# Patient Record
Sex: Male | Born: 1935
Health system: Southern US, Community
[De-identification: ages and names within clinical notes are randomized; demographics above are authoritative.]

## PROBLEM LIST (undated history)

## (undated) DIAGNOSIS — M109 Gout, unspecified: Secondary | ICD-10-CM

## (undated) DIAGNOSIS — K648 Other hemorrhoids: Secondary | ICD-10-CM

## (undated) DIAGNOSIS — M25579 Pain in unspecified ankle and joints of unspecified foot: Secondary | ICD-10-CM

## (undated) DIAGNOSIS — R609 Edema, unspecified: Secondary | ICD-10-CM

## (undated) DIAGNOSIS — M21969 Unspecified acquired deformity of unspecified lower leg: Secondary | ICD-10-CM

## (undated) DIAGNOSIS — I10 Essential (primary) hypertension: Secondary | ICD-10-CM

## (undated) DIAGNOSIS — R1011 Right upper quadrant pain: Secondary | ICD-10-CM

## (undated) DIAGNOSIS — J309 Allergic rhinitis, unspecified: Secondary | ICD-10-CM

## (undated) DIAGNOSIS — R42 Dizziness and giddiness: Secondary | ICD-10-CM

## (undated) HISTORY — DX: Other hemorrhoids: K64.8

## (undated) HISTORY — PX: CATARACT EXTRACTION: SUR2

## (undated) HISTORY — DX: Unspecified acquired deformity of unspecified lower leg: M21.969

## (undated) HISTORY — DX: Allergic rhinitis, unspecified: J30.9

## (undated) HISTORY — DX: Pain in unspecified ankle and joints of unspecified foot: M25.579

## (undated) HISTORY — DX: Dizziness and giddiness: R42

## (undated) HISTORY — DX: Edema, unspecified: R60.9

## (undated) HISTORY — DX: Essential (primary) hypertension: I10

## (undated) HISTORY — DX: Gout, unspecified: M10.9

## (undated) HISTORY — DX: Right upper quadrant pain: R10.11

---

## 2004-09-24 ENCOUNTER — Ambulatory Visit: Payer: Self-pay | Admitting: Internal Medicine

## 2004-10-02 ENCOUNTER — Ambulatory Visit: Payer: Self-pay | Admitting: Internal Medicine

## 2004-12-23 ENCOUNTER — Ambulatory Visit: Payer: Self-pay | Admitting: Internal Medicine

## 2005-01-06 ENCOUNTER — Ambulatory Visit: Payer: Self-pay | Admitting: Internal Medicine

## 2005-10-27 ENCOUNTER — Ambulatory Visit: Payer: Self-pay | Admitting: Internal Medicine

## 2006-07-28 ENCOUNTER — Emergency Department (HOSPITAL_COMMUNITY): Admission: EM | Admit: 2006-07-28 | Discharge: 2006-07-28 | Payer: Self-pay | Admitting: Emergency Medicine

## 2007-01-04 ENCOUNTER — Ambulatory Visit: Payer: Self-pay | Admitting: Internal Medicine

## 2007-01-05 ENCOUNTER — Ambulatory Visit: Payer: Self-pay | Admitting: Internal Medicine

## 2007-01-05 LAB — CONVERTED CEMR LAB
Basophils Absolute: 0 10*3/uL (ref 0.0–0.1)
Cholesterol: 183 mg/dL (ref 0–200)
Eosinophils Absolute: 0.6 10*3/uL (ref 0.0–0.6)
Eosinophils Relative: 5.4 % — ABNORMAL HIGH (ref 0.0–5.0)
HDL: 37.4 mg/dL — ABNORMAL LOW (ref >39.0)
INR: 0.9 (ref 0.9–2.0)
LDL Cholesterol: 120 mg/dL — ABNORMAL HIGH (ref 0–99)
Neutro Abs: 7.5 10*3/uL (ref 1.4–7.7)
RBC: 4.78 M/uL (ref 4.22–5.81)
Triglycerides: 130 mg/dL (ref 0–149)
VLDL: 26 mg/dL (ref 0–40)
WBC: 10.5 10*3/uL (ref 4.5–10.5)
aPTT: 29.7 s (ref 26.5–36.5)

## 2007-01-07 ENCOUNTER — Ambulatory Visit: Payer: Self-pay | Admitting: Internal Medicine

## 2007-01-21 DIAGNOSIS — R42 Dizziness and giddiness: Secondary | ICD-10-CM | POA: Insufficient documentation

## 2007-01-21 DIAGNOSIS — I10 Essential (primary) hypertension: Secondary | ICD-10-CM

## 2007-01-21 DIAGNOSIS — J309 Allergic rhinitis, unspecified: Secondary | ICD-10-CM | POA: Insufficient documentation

## 2007-01-21 HISTORY — DX: Essential (primary) hypertension: I10

## 2007-01-21 HISTORY — DX: Allergic rhinitis, unspecified: J30.9

## 2007-04-28 ENCOUNTER — Telehealth (INDEPENDENT_AMBULATORY_CARE_PROVIDER_SITE_OTHER): Payer: Self-pay | Admitting: *Deleted

## 2007-05-14 ENCOUNTER — Ambulatory Visit: Payer: Self-pay | Admitting: Internal Medicine

## 2007-05-14 DIAGNOSIS — K648 Other hemorrhoids: Secondary | ICD-10-CM | POA: Insufficient documentation

## 2007-05-14 HISTORY — DX: Other hemorrhoids: K64.8

## 2007-07-19 ENCOUNTER — Telehealth (INDEPENDENT_AMBULATORY_CARE_PROVIDER_SITE_OTHER): Payer: Self-pay | Admitting: *Deleted

## 2007-07-19 ENCOUNTER — Encounter (INDEPENDENT_AMBULATORY_CARE_PROVIDER_SITE_OTHER): Payer: Self-pay | Admitting: *Deleted

## 2008-12-18 ENCOUNTER — Ambulatory Visit: Payer: Self-pay | Admitting: Internal Medicine

## 2009-03-22 ENCOUNTER — Encounter: Payer: Self-pay | Admitting: Internal Medicine

## 2009-10-15 ENCOUNTER — Telehealth (INDEPENDENT_AMBULATORY_CARE_PROVIDER_SITE_OTHER): Payer: Self-pay | Admitting: *Deleted

## 2009-10-16 ENCOUNTER — Ambulatory Visit: Payer: Self-pay | Admitting: Family

## 2009-10-16 DIAGNOSIS — R1011 Right upper quadrant pain: Secondary | ICD-10-CM | POA: Insufficient documentation

## 2009-10-16 LAB — CONVERTED CEMR LAB
ALT: 17 units/L (ref 0–53)
Alkaline Phosphatase: 71 units/L (ref 39–117)
Amylase: 68 units/L (ref 27–131)
Eosinophils Relative: 6.2 % — ABNORMAL HIGH (ref 0.0–5.0)
Hemoglobin: 14.1 g/dL (ref 13.0–17.0)
Lymphocytes Relative: 22.7 % (ref 12.0–46.0)
Lymphs Abs: 1.9 10*3/uL (ref 0.7–4.0)
Monocytes Absolute: 1.1 10*3/uL — ABNORMAL HIGH (ref 0.1–1.0)
Monocytes Relative: 13.8 % — ABNORMAL HIGH (ref 3.0–12.0)
Neutrophils Relative %: 56.9 % (ref 43.0–77.0)
Platelets: 189 10*3/uL (ref 150.0–400.0)
RBC: 4.63 M/uL (ref 4.22–5.81)
RDW: 12.4 % (ref 11.5–14.6)
Total Protein: 6.8 g/dL (ref 6.0–8.3)

## 2009-10-22 ENCOUNTER — Encounter: Admission: RE | Admit: 2009-10-22 | Discharge: 2009-10-22 | Payer: Self-pay | Admitting: Internal Medicine

## 2009-10-24 ENCOUNTER — Telehealth (INDEPENDENT_AMBULATORY_CARE_PROVIDER_SITE_OTHER): Payer: Self-pay | Admitting: *Deleted

## 2010-10-06 ENCOUNTER — Encounter: Payer: Self-pay | Admitting: Internal Medicine

## 2010-10-15 NOTE — Procedures (Signed)
Summary: EGD/[facility]  EGD/[facility]   Imported By: Lanelle Bal 10/22/2009 13:31:51  _____________________________________________________________________  External Attachment:    Type:   Image     Comment:   External Document

## 2010-10-15 NOTE — Assessment & Plan Note (Signed)
Summary: pain under right rib/alr   Vital Signs:  Patient profile:   77 year old male Height:      64.25 inches Weight:      138.8 pounds BMI:     23.73 Pulse rate:   70 / minute BP sitting:   140 / 64  (right arm)  Vitals Entered By: Doristine Devoid (October 16, 2009 10:50 AM) CC: tenderness under R ribe x2 wks    CC:  tenderness under R ribe x2 wks .  History of Present Illness: Danny Hudson is a 77year old male who presents with c/o discomfort under right rib x 2 weeks.  Denies pain to palpation, Denies associated nausea.  Appetite has been good.  Pain is not made worse by eating. He is accompanied today by his daughter.  Patient had endoscopy performed in in [place] back un July due to abdominal pain.  Daughter brings this report with him  Impression noted inflammation of the antrum of the stomach.  He had a biopsy to r/o H. Pylori which daughter tells me was normal.  He also had a colonoscopy performed at this visit which was + for polyp- benign per daughter.      EGD  Procedure date:  03/22/2009  Findings:      Location: [employer], [place] Findings: 1) Inflammation in the body and the antrum of the stomach                2) Rule out H.Pylori  Colonoscopy  Procedure date:  03/22/2009  Findings:       Results: Normal. 1) 8mm sessile polyp   Comments:      Repeat colonoscopy in 5 years.   Allergies: No Known Drug Allergies  Review of Systems       Denies fever.  Notes chronic gas/flatulence but is at his baseline.  Daughter notes that patient frequently has BM's after eating- but this has been going on for some time.  Patient has 1-2 stools a day.    Physical Exam  General:  Well-developed,well-nourished,in no acute distress; alert,appropriate and cooperative throughout examination Lungs:  Normal respiratory effort, chest expands symmetrically. Lungs are clear to auscultation, no crackles or wheezes. Heart:  Normal rate and regular rhythm. S1 and  S2 normal without gallop, murmur, click, rub or other extra sounds. Abdomen:  No guarding, no reproducible tenderness, no distension.  negative murphys   Impression & Recommendations:  Problem # 1:  RUQ PAIN (ICD-789.01) Assessment New Will check below labs, will also check abdominal US to r/o cholilithiasis/cholecystitis.   Orders: TLB-Hepatic/Liver Function Pnl (80076-HEPATIC) T-Amylase (16109-60454) Lipase-[facility] (09811-91478) TLB-CBC Platelet - w/Differential (85025-CBCD) Misc. Referral (Misc. Ref)  Complete Medication List: 1)  Adult Aspirin Ec Low Strength 81 Mg Tbec (Aspirin) .Marland Kitchen.. 1 by mouth once daily 2)  Exforge 10-160 Mg Tabs (Amlodipine besylate-valsartan) .... 1/2  by mouth once daily 3)  Flonase 50 Mcg/act Susp (Fluticasone propionate) .... 2 sprays on each side of the nose once daily 4)  Claritin 10 Mg Caps (Loratadine) .Marland Kitchen.. 1 otc tablet a day 5)  Alocril 2 % Soln (Nedocromil sodium) .... 2 gtts each eye two times a day  Patient Instructions: 1)  Please complete your lab work today. 2)  You will be contacted about your abdominal ultrasound 3)  Please schedule a follow-up appointment in 2 weeks- sooner if worsening abdominal pain, nausea or vomitting

## 2010-10-15 NOTE — Progress Notes (Signed)
Summary: RUQ PAIN,DIARRHEA  Phone Note Call from Patient Call back at [phone]   Caller: Daughter Call For: [facility] E[facility] MD Reason for Call: Talk to Nurse Summary of Call: PT DAUGHTER, Danny Hudson, STATES PT C/O 2WKS OF RUQ PAIN, ALSO HAS BEGAN HAVING DIARRHEA.  REQUESTING APPT TO BE SEEN BY DR. Drue Novel FOR TODAY. Initial call taken by: Magdalen Spatz [facility],  October 15, 2009 10:36 AM  Follow-up for Phone Call        s/w pt daughter whoo says pt is having pain under rigth ribcage and feels hard, having some  loose stools.  OV scheduled with NP recommend if signs persist or worsen ED today. canuse Imodium for diarrhea drinks lots of fluids .Kandice Hams  October 15, 2009 11:10 AM  Follow-up by: Kandice Hams,  October 15, 2009 11:10 AM

## 2010-10-15 NOTE — Progress Notes (Signed)
Summary: ultrasound results   Phone Note Call from Patient Call back at Home Phone [phone]   Caller: Daughter Summary of Call: patient daughter called would like to know results of ultrasound informed that after reviewing report that everything is normal and labs where normal but I will have Melissa review and if anything else different will let her know. Also says that whites of fathers eye is yellow and wanted to know if theres anything to be concerned about.Marland KitchenMarland KitchenDoristine Devoid  October 24, 2009 11:57 AM  Initial call taken by: Doristine Devoid,  October 24, 2009 11:57 AM  Follow-up for Phone Call        ultrasound looks ok.  He should been scheduled to be seen to evaluate his "yellow eyes".  He is a Paz patient.  I can see if he is booked.  Thanks Follow-up by: Lemont Fillers FNP,  October 24, 2009 12:41 PM  Additional Follow-up for Phone Call Additional follow up Details #1::        spoke w/ patient daughter informed that ultrasound normal and that if white's of eye is still yellow that he should schedule f/u w/ Dr. Drue Novel says she will check when he come home and if they are in fact still yellow she will have him schedule appt.Marland KitchenMarland KitchenMarland KitchenDoristine Devoid  October 25, 2009 1:28 PM

## 2010-10-15 NOTE — Letter (Signed)
   [facility] [address] [place], [place] [address] [phone]    October 16, 2009   Red Schlottman [address] [place], [place] [address]  RE:  LAB RESULTS  Dear  Mr. DAFOE,  The following is an interpretation of your most recent lab tests.  Please take note of any instructions provided or changes to medications that have resulted from your lab work.  LIVER FUNCTION TESTS:  Good - no changes needed   CBC:  Good - no changes needed Pancreatic enzymes normal also.     Sincerely Yours,    Lemont Fillers FNP

## 2010-10-15 NOTE — Procedures (Signed)
Summary: Colonoscopy, 1 polyp---/[facility]  Colonoscopy/[facility]   Imported By: Lanelle Bal 10/22/2009 13:33:28  _____________________________________________________________________  External Attachment:    Type:   Image     Comment:   External Document

## 2013-03-11 ENCOUNTER — Ambulatory Visit (INDEPENDENT_AMBULATORY_CARE_PROVIDER_SITE_OTHER): Payer: Medicare Other | Admitting: Podiatry

## 2013-03-11 DIAGNOSIS — M21969 Unspecified acquired deformity of unspecified lower leg: Secondary | ICD-10-CM

## 2013-03-11 DIAGNOSIS — M25579 Pain in unspecified ankle and joints of unspecified foot: Secondary | ICD-10-CM

## 2013-03-11 DIAGNOSIS — M25571 Pain in right ankle and joints of right foot: Secondary | ICD-10-CM

## 2013-03-11 DIAGNOSIS — R609 Edema, unspecified: Secondary | ICD-10-CM | POA: Insufficient documentation

## 2013-03-11 NOTE — Progress Notes (Signed)
Subjective: 79 y.o. year old male patient presents complaining of difficulty walking and swelling in right foot. Hurts at the big joint after been walking for a while. He has had swelling and pain on dorsal aspect, proximal end of lesser digits right foot x 1 month, which has subsided at this time.  He also has weakness on right side, from shoulder to lower leg.   Objective: Dermatologic: No open lesions, no edema or erythema noted. Neurologic: All epicritic and tactile sensations grossly intact. Orthopedic: Hypermobile and elevated first ray with bunion bilateral. No pain with range of motion at the first MPJ bilateral. Pain at the first MPJ upon prolonged ambulation. Vascular: All pedal pulses are faintly palpable.  Assessment: Osteoarthropathy first MPJ right. Deformed metatarsal first bilateral. Hypermobile first ray bilateral.  Treatment: Rx. Naprelan. 750mg  Naprelan sample dispensed to take one a day as needed.

## 2013-08-31 ENCOUNTER — Other Ambulatory Visit (HOSPITAL_COMMUNITY): Payer: Self-pay | Admitting: Cardiology

## 2013-08-31 DIAGNOSIS — R0789 Other chest pain: Secondary | ICD-10-CM

## 2013-09-06 ENCOUNTER — Ambulatory Visit (HOSPITAL_COMMUNITY): Payer: Medicare Other

## 2013-09-06 ENCOUNTER — Encounter (HOSPITAL_COMMUNITY): Payer: Medicare Other

## 2014-11-22 DIAGNOSIS — J309 Allergic rhinitis, unspecified: Secondary | ICD-10-CM | POA: Diagnosis not present

## 2014-11-28 DIAGNOSIS — N400 Enlarged prostate without lower urinary tract symptoms: Secondary | ICD-10-CM | POA: Diagnosis not present

## 2014-11-28 DIAGNOSIS — D291 Benign neoplasm of prostate: Secondary | ICD-10-CM | POA: Diagnosis not present

## 2014-11-28 DIAGNOSIS — E780 Pure hypercholesterolemia: Secondary | ICD-10-CM | POA: Diagnosis not present

## 2014-11-28 DIAGNOSIS — K2970 Gastritis, unspecified, without bleeding: Secondary | ICD-10-CM | POA: Diagnosis not present

## 2014-11-28 DIAGNOSIS — I10 Essential (primary) hypertension: Secondary | ICD-10-CM | POA: Diagnosis not present

## 2014-11-30 DIAGNOSIS — R1033 Periumbilical pain: Secondary | ICD-10-CM | POA: Diagnosis not present

## 2014-11-30 DIAGNOSIS — K2970 Gastritis, unspecified, without bleeding: Secondary | ICD-10-CM | POA: Diagnosis not present

## 2014-11-30 DIAGNOSIS — K225 Diverticulum of esophagus, acquired: Secondary | ICD-10-CM | POA: Diagnosis not present

## 2014-11-30 DIAGNOSIS — H35372 Puckering of macula, left eye: Secondary | ICD-10-CM | POA: Diagnosis not present

## 2014-11-30 DIAGNOSIS — K2940 Chronic atrophic gastritis without bleeding: Secondary | ICD-10-CM | POA: Diagnosis not present

## 2014-11-30 DIAGNOSIS — K2950 Unspecified chronic gastritis without bleeding: Secondary | ICD-10-CM | POA: Diagnosis not present

## 2014-12-12 DIAGNOSIS — R932 Abnormal findings on diagnostic imaging of liver and biliary tract: Secondary | ICD-10-CM | POA: Diagnosis not present

## 2014-12-12 DIAGNOSIS — R109 Unspecified abdominal pain: Secondary | ICD-10-CM | POA: Diagnosis not present

## 2014-12-12 DIAGNOSIS — N281 Cyst of kidney, acquired: Secondary | ICD-10-CM | POA: Diagnosis not present

## 2014-12-13 DIAGNOSIS — E780 Pure hypercholesterolemia: Secondary | ICD-10-CM | POA: Diagnosis not present

## 2014-12-13 DIAGNOSIS — I10 Essential (primary) hypertension: Secondary | ICD-10-CM | POA: Diagnosis not present

## 2014-12-13 DIAGNOSIS — K2970 Gastritis, unspecified, without bleeding: Secondary | ICD-10-CM | POA: Diagnosis not present

## 2015-03-21 ENCOUNTER — Encounter: Payer: Self-pay | Admitting: Podiatry

## 2015-03-21 ENCOUNTER — Ambulatory Visit (INDEPENDENT_AMBULATORY_CARE_PROVIDER_SITE_OTHER): Payer: Medicare Other | Admitting: Podiatry

## 2015-03-21 VITALS — BP 142/63 | HR 67 | Ht 60.24 in | Wt 138.0 lb

## 2015-03-21 DIAGNOSIS — M25571 Pain in right ankle and joints of right foot: Secondary | ICD-10-CM

## 2015-03-21 DIAGNOSIS — M10079 Idiopathic gout, unspecified ankle and foot: Secondary | ICD-10-CM | POA: Diagnosis not present

## 2015-03-21 DIAGNOSIS — M10071 Idiopathic gout, right ankle and foot: Secondary | ICD-10-CM

## 2015-03-21 DIAGNOSIS — M109 Gout, unspecified: Secondary | ICD-10-CM | POA: Insufficient documentation

## 2015-03-21 HISTORY — DX: Gout, unspecified: M10.9

## 2015-03-21 NOTE — Progress Notes (Signed)
Complaining of frequent Gout in right foot.  Pain in right lower limb and swelling on ankles. Requests for blood work.  Objective: Neurovascular status are within normal. Mild pedal edema noted. No abnormal color change or temperature change.  Assessment: Chronic Gout right foot.  Plan: Blood drawn to check on Uric Acid.

## 2015-03-21 NOTE — Patient Instructions (Signed)
Blood drawn to check on Uric Acid.

## 2015-04-04 ENCOUNTER — Encounter: Payer: Self-pay | Admitting: Podiatry

## 2015-04-04 ENCOUNTER — Other Ambulatory Visit: Payer: Self-pay | Admitting: *Deleted

## 2015-06-06 DIAGNOSIS — R011 Cardiac murmur, unspecified: Secondary | ICD-10-CM | POA: Diagnosis not present

## 2015-06-06 DIAGNOSIS — I10 Essential (primary) hypertension: Secondary | ICD-10-CM | POA: Diagnosis not present

## 2015-06-06 DIAGNOSIS — R609 Edema, unspecified: Secondary | ICD-10-CM | POA: Diagnosis not present

## 2015-06-06 DIAGNOSIS — Z125 Encounter for screening for malignant neoplasm of prostate: Secondary | ICD-10-CM | POA: Diagnosis not present

## 2015-06-11 ENCOUNTER — Telehealth: Payer: Self-pay | Admitting: Cardiology

## 2015-06-11 NOTE — Telephone Encounter (Signed)
Rec'd from  [facility] forward 9 pages to Dr. Hochrein °

## 2015-06-14 ENCOUNTER — Telehealth: Payer: Self-pay | Admitting: Cardiology

## 2015-06-14 NOTE — Telephone Encounter (Signed)
Received a referral packet from [facility] in interoffice bag from [facility] office for upcoming appointment with Dr. Percival Spanish on 07/10/2015.  Records given to [facility].  cbr

## 2015-07-03 DIAGNOSIS — R609 Edema, unspecified: Secondary | ICD-10-CM | POA: Diagnosis not present

## 2015-07-03 DIAGNOSIS — Z125 Encounter for screening for malignant neoplasm of prostate: Secondary | ICD-10-CM | POA: Diagnosis not present

## 2015-07-03 DIAGNOSIS — I10 Essential (primary) hypertension: Secondary | ICD-10-CM | POA: Diagnosis not present

## 2015-07-06 DIAGNOSIS — R739 Hyperglycemia, unspecified: Secondary | ICD-10-CM | POA: Diagnosis not present

## 2015-07-10 ENCOUNTER — Encounter: Payer: Self-pay | Admitting: Cardiology

## 2015-07-10 ENCOUNTER — Ambulatory Visit (INDEPENDENT_AMBULATORY_CARE_PROVIDER_SITE_OTHER): Payer: Medicare Other | Admitting: Cardiology

## 2015-07-10 VITALS — BP 132/60 | HR 67 | Ht 63.5 in | Wt 137.7 lb

## 2015-07-10 DIAGNOSIS — I10 Essential (primary) hypertension: Secondary | ICD-10-CM | POA: Diagnosis not present

## 2015-07-10 DIAGNOSIS — R011 Cardiac murmur, unspecified: Secondary | ICD-10-CM | POA: Diagnosis not present

## 2015-07-10 NOTE — Progress Notes (Signed)
Cardiology Office Note   Date:  07/10/2015   ID:  Danny Hudson, Danny Hudson 14-Jul-1928, MRN [id number]  PCP:  Kathlene November, MD  Cardiologist:   Minus Breeding, MD   Chief Complaint  Patient presents with  . Heart Murmur      History of Present Illness: Danny Hudson is a 81 y.o. male who presents for evaluation of a murmur. He has no past cardiac history. He is active and looks much younger than his stated age. He walks daily. He's not had any prior cardiac workup or history and was told recently that he had a heart murmur. The patient denies any new symptoms such as chest discomfort, neck or arm discomfort. There has been no new shortness of breath, PND or orthopnea. There have been no reported palpitations, presyncope or syncope   Past Medical History  Diagnosis Date  . HEMORRHOIDS, INTERNAL W/O COMPLICATION 3/00/7622    Qualifier: Diagnosis of  By: Larose Kells MD, [place] HYPERTENSION 01/21/2007    Qualifier: Diagnosis of  By: Larose Kells MD, [place] 01/21/2007    Qualifier: Diagnosis of  By: Larose Kells MD, [place] DIZZINESS 01/21/2007    Qualifier: Diagnosis of  By: Larose Kells MD, [place] Edema 03/11/2013  . Gout of ankle 03/21/2015  . Metatarsal deformity 03/11/2013  . Pain in joint, ankle and foot 03/11/2013  . RUQ PAIN 10/16/2009    Qualifier: Diagnosis of  By: Inda Castle FNP, [place]     Past Surgical History  Procedure Laterality Date  . Cataract extraction       Current Outpatient Prescriptions  Medication Sig Dispense Refill  . amLODipine-valsartan (EXFORGE) 10-160 MG tablet Take 1 tablet by mouth daily.    Marland Kitchen glucosamine-chondroitin 500-400 MG tablet Take 1 tablet by mouth daily.    . Omega-3 Fatty Acids (FISH OIL) 1200 MG CAPS Take 1 capsule by mouth daily.     No current facility-administered medications for this visit.    Allergies:   Review of patient's allergies indicates no known allergies.    Social History:  The patient  reports that he has never smoked. He  has never used smokeless tobacco.   Family History:  The patient's family history includes Sudden death (age of onset: 48) in his father.    ROS:  Please see the history of present illness.   Otherwise, review of systems are positive for decreased hearing and mild back pain.   All other systems are reviewed and negative.    PHYSICAL EXAM: VS:  BP 132/60 mmHg  Pulse 67  Ht 5' 3.5" (1.613 m)  Wt 137 lb 11.2 oz (62.46 kg)  BMI 24.01 kg/m2 , BMI Body mass index is 24.01 kg/(m^2). GENERAL:  Well appearing and looks much younger than stated age 66:  Pupils equal round and reactive, fundi not visualized, oral mucosa unremarkable, dentures NECK:  No jugular venous distention, waveform within normal limits, carotid upstroke brisk and symmetric, no bruits, no thyromegaly LYMPHATICS:  No cervical, inguinal adenopathy LUNGS:  Clear to auscultation bilaterally BACK:  No CVA tenderness CHEST:  Unremarkable HEART:  PMI not displaced or sustained,S1 and S2 within normal limits, no S3, no S4, no clicks, no rubs, 3 out of 6 apical mid to late murmur systolic that radiates to the axilla, no diastolic murmurs ABD:  Flat, positive bowel sounds normal in frequency in pitch, no bruits, no rebound, no guarding, no midline  pulsatile mass, no hepatomegaly, no splenomegaly EXT:  2 plus pulses throughout, no edema, no cyanosis no clubbing SKIN:  No rashes no nodules NEURO:  Cranial nerves II through XII grossly intact, motor grossly intact throughout PSYCH:  Cognitively intact, oriented to person place and time    EKG:  EKG is ordered today. The ekg ordered today demonstrates right bundle branch block, rate 67, axis within normal limits, intervals within normal limits, no acute ST-T wave changes.   Recent Labs: No results found for requested labs within last 365 days.    Lipid Panel    Component Value Date/Time   CHOL 183 01/05/2007 0814   TRIG 130 01/05/2007 0814   HDL 37.4* 01/05/2007 0814    CHOLHDL 4.9 CALC 01/05/2007 0814   VLDL 26 01/05/2007 0814   LDLCALC 120* 01/05/2007 0814      Wt Readings from Last 3 Encounters:  07/10/15 137 lb 11.2 oz (62.46 kg)  03/21/15 138 lb (62.596 kg)  10/16/09 138 lb 12.8 oz (62.959 kg)      Other studies Reviewed: Additional studies/ records that were reviewed today include: Office records. Review of the above records demonstrates:  Please see elsewhere in the note.     ASSESSMENT AND PLAN:  MURMUR:  I suspect some mitral regurgitation and I will quantify this with an echocardiogram. However, I doubt that we'll have to do anything about this as he is completely asymptomatic.  RBBB:  I don't have an old EKG for comparison. He has no other symptoms of conduction disturbance. No further change in therapy is indicated.  HTN:  The blood pressure is at target. No change in medications is indicated. We will continue with therapeutic lifestyle changes (TLC).    Current medicines are reviewed at length with the patient today.  The patient does not have concerns regarding medicines.  The following changes have been made:  no change  Labs/ tests ordered today include:  No orders of the defined types were placed in this encounter.     Disposition:   FU with me as needed.     Signed, Minus Breeding, MD  07/10/2015 3:27 PM    [facility]

## 2015-07-10 NOTE — Patient Instructions (Signed)
Your physician recommends that you schedule a follow-up appointment in: As Needed  Your physician has requested that you have an echocardiogram. Echocardiography is a painless test that uses sound waves to create images of your heart. It provides your doctor with information about the size and shape of your heart and how well your heart's chambers and valves are working. This procedure takes approximately one hour. There are no restrictions for this procedure.     

## 2015-07-18 ENCOUNTER — Other Ambulatory Visit: Payer: Self-pay

## 2015-07-18 ENCOUNTER — Ambulatory Visit (HOSPITAL_COMMUNITY): Payer: Medicare Other | Attending: Cardiology

## 2015-07-18 DIAGNOSIS — I5189 Other ill-defined heart diseases: Secondary | ICD-10-CM | POA: Diagnosis not present

## 2015-07-18 DIAGNOSIS — I340 Nonrheumatic mitral (valve) insufficiency: Secondary | ICD-10-CM | POA: Insufficient documentation

## 2015-07-18 DIAGNOSIS — I071 Rheumatic tricuspid insufficiency: Secondary | ICD-10-CM | POA: Diagnosis not present

## 2015-07-18 DIAGNOSIS — I341 Nonrheumatic mitral (valve) prolapse: Secondary | ICD-10-CM | POA: Insufficient documentation

## 2015-07-18 DIAGNOSIS — I351 Nonrheumatic aortic (valve) insufficiency: Secondary | ICD-10-CM | POA: Insufficient documentation

## 2015-07-18 DIAGNOSIS — R011 Cardiac murmur, unspecified: Secondary | ICD-10-CM | POA: Diagnosis not present

## 2015-10-01 DIAGNOSIS — I10 Essential (primary) hypertension: Secondary | ICD-10-CM | POA: Diagnosis not present

## 2015-10-01 DIAGNOSIS — R739 Hyperglycemia, unspecified: Secondary | ICD-10-CM | POA: Diagnosis not present

## 2015-10-05 DIAGNOSIS — Z125 Encounter for screening for malignant neoplasm of prostate: Secondary | ICD-10-CM | POA: Diagnosis not present

## 2015-10-05 DIAGNOSIS — I10 Essential (primary) hypertension: Secondary | ICD-10-CM | POA: Diagnosis not present

## 2015-12-21 DIAGNOSIS — J310 Chronic rhinitis: Secondary | ICD-10-CM | POA: Diagnosis not present

## 2015-12-21 DIAGNOSIS — K648 Other hemorrhoids: Secondary | ICD-10-CM | POA: Diagnosis not present

## 2015-12-21 DIAGNOSIS — I872 Venous insufficiency (chronic) (peripheral): Secondary | ICD-10-CM | POA: Diagnosis not present

## 2015-12-21 DIAGNOSIS — R600 Localized edema: Secondary | ICD-10-CM | POA: Diagnosis not present

## 2015-12-21 DIAGNOSIS — Z87891 Personal history of nicotine dependence: Secondary | ICD-10-CM | POA: Diagnosis not present

## 2015-12-21 DIAGNOSIS — E785 Hyperlipidemia, unspecified: Secondary | ICD-10-CM | POA: Diagnosis not present

## 2015-12-21 DIAGNOSIS — R739 Hyperglycemia, unspecified: Secondary | ICD-10-CM | POA: Diagnosis not present

## 2015-12-28 DIAGNOSIS — R0989 Other specified symptoms and signs involving the circulatory and respiratory systems: Secondary | ICD-10-CM | POA: Diagnosis not present

## 2015-12-28 DIAGNOSIS — K649 Unspecified hemorrhoids: Secondary | ICD-10-CM | POA: Diagnosis not present

## 2016-04-08 DIAGNOSIS — I10 Essential (primary) hypertension: Secondary | ICD-10-CM | POA: Diagnosis not present

## 2016-04-08 DIAGNOSIS — E7800 Pure hypercholesterolemia, unspecified: Secondary | ICD-10-CM | POA: Diagnosis not present

## 2016-04-08 DIAGNOSIS — E559 Vitamin D deficiency, unspecified: Secondary | ICD-10-CM | POA: Diagnosis not present

## 2016-04-24 DIAGNOSIS — K2960 Other gastritis without bleeding: Secondary | ICD-10-CM | POA: Diagnosis not present

## 2016-04-24 DIAGNOSIS — R194 Change in bowel habit: Secondary | ICD-10-CM | POA: Diagnosis not present

## 2016-04-24 DIAGNOSIS — K635 Polyp of colon: Secondary | ICD-10-CM | POA: Diagnosis not present

## 2016-04-24 DIAGNOSIS — H04123 Dry eye syndrome of bilateral lacrimal glands: Secondary | ICD-10-CM | POA: Diagnosis not present

## 2016-04-24 DIAGNOSIS — K2940 Chronic atrophic gastritis without bleeding: Secondary | ICD-10-CM | POA: Diagnosis not present

## 2016-04-24 DIAGNOSIS — H353132 Nonexudative age-related macular degeneration, bilateral, intermediate dry stage: Secondary | ICD-10-CM | POA: Diagnosis not present

## 2016-04-24 DIAGNOSIS — D122 Benign neoplasm of ascending colon: Secondary | ICD-10-CM | POA: Diagnosis not present

## 2016-06-29 ENCOUNTER — Encounter: Payer: Self-pay | Admitting: *Deleted

## 2016-09-26 DIAGNOSIS — Z87891 Personal history of nicotine dependence: Secondary | ICD-10-CM | POA: Diagnosis not present

## 2016-09-26 DIAGNOSIS — Z0000 Encounter for general adult medical examination without abnormal findings: Secondary | ICD-10-CM | POA: Diagnosis not present

## 2016-09-26 DIAGNOSIS — Z6824 Body mass index (BMI) 24.0-24.9, adult: Secondary | ICD-10-CM | POA: Diagnosis not present

## 2016-09-26 DIAGNOSIS — Z79899 Other long term (current) drug therapy: Secondary | ICD-10-CM | POA: Diagnosis not present

## 2016-09-26 DIAGNOSIS — I10 Essential (primary) hypertension: Secondary | ICD-10-CM | POA: Diagnosis not present

## 2017-03-17 DIAGNOSIS — I10 Essential (primary) hypertension: Secondary | ICD-10-CM | POA: Diagnosis not present

## 2017-03-17 DIAGNOSIS — Z125 Encounter for screening for malignant neoplasm of prostate: Secondary | ICD-10-CM | POA: Diagnosis not present

## 2017-03-17 DIAGNOSIS — R040 Epistaxis: Secondary | ICD-10-CM | POA: Diagnosis not present

## 2017-03-30 DIAGNOSIS — E559 Vitamin D deficiency, unspecified: Secondary | ICD-10-CM | POA: Diagnosis not present

## 2017-03-30 DIAGNOSIS — H3561 Retinal hemorrhage, right eye: Secondary | ICD-10-CM | POA: Diagnosis not present

## 2017-03-30 DIAGNOSIS — N401 Enlarged prostate with lower urinary tract symptoms: Secondary | ICD-10-CM | POA: Diagnosis not present

## 2017-03-30 DIAGNOSIS — E7800 Pure hypercholesterolemia, unspecified: Secondary | ICD-10-CM | POA: Diagnosis not present

## 2017-03-30 DIAGNOSIS — I10 Essential (primary) hypertension: Secondary | ICD-10-CM | POA: Diagnosis not present

## 2017-04-21 DIAGNOSIS — R040 Epistaxis: Secondary | ICD-10-CM | POA: Diagnosis not present

## 2017-06-02 ENCOUNTER — Encounter: Payer: Self-pay | Admitting: Podiatry

## 2017-06-02 ENCOUNTER — Ambulatory Visit (INDEPENDENT_AMBULATORY_CARE_PROVIDER_SITE_OTHER): Payer: Medicare HMO | Admitting: Podiatry

## 2017-06-02 VITALS — BP 123/51 | HR 64

## 2017-06-02 DIAGNOSIS — L309 Dermatitis, unspecified: Secondary | ICD-10-CM

## 2017-06-02 DIAGNOSIS — R600 Localized edema: Secondary | ICD-10-CM | POA: Diagnosis not present

## 2017-06-02 MED ORDER — BETAMETHASONE DIPROPIONATE 0.05 % EX CREA: TOPICAL_CREAM | Freq: Two times a day (BID) | CUTANEOUS | 0 refills | Status: DC

## 2017-06-02 MED ORDER — METHYLPREDNISOLONE 4 MG PO TBPK: ORAL_TABLET | ORAL | 0 refills | Status: DC

## 2017-06-02 NOTE — Progress Notes (Signed)
Subjective: 83 y.o. year old male patient presents with red blistered skin lesion over the left medial ankle joint area that is covering about 6x8 cm duration of over 2 months.   Objective: Dermatologic: Red macerated and oozing skin without active drainage or surrounding inflammation left medial ankle about 6 x 8 cm wide. Vascular: All pedal pulses are faintly palpable. Mild ankle edema left foot. Itch skin at lateral ankle with dry scratch marks. Neurologic: All epicritic and tactile sensations grossly intact. Orthopedic: Hypermobile and elevated first ray with bunion bilateral.  Assessment: Possible contact dermatitis left ankle. Ankle edema left.  Treatment: Reviewed findings and available treatment options. Wound cleansed with H2O2 and Amerigel ointment dressing applied. Home care instruction given with Amerigel ointment. Rx. Medrol dose pack and Betamethasone cream.

## 2017-06-02 NOTE — Patient Instructions (Signed)
Seen for swollen red skin lesion left ankle. Possible allergic dermatitis. Medrol dose pack and Betamethasone cream prescribed. May use Amerigel cream to the left ankle wound with gauze dressing. Return as needed.

## 2017-06-06 ENCOUNTER — Encounter: Payer: Self-pay | Admitting: *Deleted

## 2017-06-06 DIAGNOSIS — Z23 Encounter for immunization: Secondary | ICD-10-CM

## 2017-06-06 DIAGNOSIS — Z139 Encounter for screening, unspecified: Secondary | ICD-10-CM

## 2017-06-06 LAB — GLUCOSE, POCT (MANUAL RESULT ENTRY): POC GLUCOSE: 219 mg/dL — AB (ref 70–99)

## 2017-06-06 NOTE — Congregational Nurse Program (Signed)
Congregational Nurse Program Note  Date of Encounter: 06/06/2017  Past Medical History: Past Medical History:  Diagnosis Date  . ALLERGIC RHINITIS 01/21/2007   Qualifier: Diagnosis of  By: Larose Kells MD, [place] DIZZINESS 01/21/2007   Qualifier: Diagnosis of  By: Larose Kells MD, [place] Edema 03/11/2013  . Gout of ankle 03/21/2015  . HEMORRHOIDS, INTERNAL W/O COMPLICATION 03/12/3661   Qualifier: Diagnosis of  By: Larose Kells MD, [place] HYPERTENSION 01/21/2007   Qualifier: Diagnosis of  By: Larose Kells MD, [place] deformity 03/11/2013  . Pain in joint, ankle and foot 03/11/2013  . RUQ PAIN 10/16/2009   Qualifier: Diagnosis of  By: Inda Castle FNP, [place]     Encounter Details:     CNP Questionnaire - 06/06/17 0840      Patient Demographics   Is this a new or existing patient? New   Patient is considered a/an Immigrant   Race Asian     Patient Assistance   Location of Patient Assistance Not Applicable  [facility]   Patient's financial/insurance status Medicare   Uninsured Patient ([facility]) No   Patient referred to apply for the following financial assistance Not Applicable   Food insecurities addressed Not Applicable   Transportation assistance No   Assistance securing medications No   Educational health offerings Diabetes;Spiritual care;Safety     Encounter Details   Primary purpose of visit Other  check with family Dr.   Was an Emergency Department visit averted? Not Applicable   Does patient have a medical provider? Yes  Kim,James   Patient referred to Other  family Dr   Was a mental health screening completed? (GAINS tool) No   Does patient have dental issues? No   Does patient have vision issues? No   Does your patient have an abnormal blood pressure today? Yes  on medicine per pt.   Since previous encounter, have you referred patient for abnormal blood pressure that resulted in a new diagnosis or medication change? --  see MD   Does your patient have an  abnormal blood glucose today? Yes  CBG 219 non fasting   Since previous encounter, have you referred patient for abnormal blood glucose that resulted in a new diagnosis or medication change? No  check with family MD   Was there a life-saving intervention made? No      Pt came for flu shot, and show me the Lt ankle. Lt leg 2+ pitting edema. Dried brown colored ankle.told  Been vs foot Dr, but not help any. Told me blood sugar  was high, no blood works done at [employer] office per pt. Checked BP, and CBG, told pt to make an appointment with primary Dr. Notes gave him to show the Dr.

## 2017-06-10 DIAGNOSIS — I10 Essential (primary) hypertension: Secondary | ICD-10-CM | POA: Diagnosis not present

## 2017-06-10 DIAGNOSIS — Z125 Encounter for screening for malignant neoplasm of prostate: Secondary | ICD-10-CM | POA: Diagnosis not present

## 2017-06-16 DIAGNOSIS — Z0000 Encounter for general adult medical examination without abnormal findings: Secondary | ICD-10-CM | POA: Diagnosis not present

## 2017-06-16 DIAGNOSIS — R21 Rash and other nonspecific skin eruption: Secondary | ICD-10-CM | POA: Diagnosis not present

## 2017-06-16 DIAGNOSIS — E7800 Pure hypercholesterolemia, unspecified: Secondary | ICD-10-CM | POA: Diagnosis not present

## 2017-06-16 DIAGNOSIS — I10 Essential (primary) hypertension: Secondary | ICD-10-CM | POA: Diagnosis not present

## 2017-08-04 DIAGNOSIS — H1013 Acute atopic conjunctivitis, bilateral: Secondary | ICD-10-CM | POA: Diagnosis not present

## 2017-08-04 DIAGNOSIS — H35372 Puckering of macula, left eye: Secondary | ICD-10-CM | POA: Diagnosis not present

## 2017-08-04 DIAGNOSIS — Z961 Presence of intraocular lens: Secondary | ICD-10-CM | POA: Diagnosis not present

## 2017-08-04 DIAGNOSIS — H353131 Nonexudative age-related macular degeneration, bilateral, early dry stage: Secondary | ICD-10-CM | POA: Diagnosis not present

## 2017-10-13 DIAGNOSIS — R21 Rash and other nonspecific skin eruption: Secondary | ICD-10-CM | POA: Diagnosis not present

## 2017-12-15 DIAGNOSIS — E7800 Pure hypercholesterolemia, unspecified: Secondary | ICD-10-CM | POA: Diagnosis not present

## 2017-12-15 DIAGNOSIS — R739 Hyperglycemia, unspecified: Secondary | ICD-10-CM | POA: Diagnosis not present

## 2017-12-15 DIAGNOSIS — I10 Essential (primary) hypertension: Secondary | ICD-10-CM | POA: Diagnosis not present

## 2017-12-15 DIAGNOSIS — J301 Allergic rhinitis due to pollen: Secondary | ICD-10-CM | POA: Diagnosis not present

## 2018-01-18 DIAGNOSIS — J309 Allergic rhinitis, unspecified: Secondary | ICD-10-CM | POA: Diagnosis not present

## 2018-01-18 DIAGNOSIS — Z87891 Personal history of nicotine dependence: Secondary | ICD-10-CM | POA: Diagnosis not present

## 2018-01-18 DIAGNOSIS — I10 Essential (primary) hypertension: Secondary | ICD-10-CM | POA: Diagnosis not present

## 2018-01-18 DIAGNOSIS — E785 Hyperlipidemia, unspecified: Secondary | ICD-10-CM | POA: Diagnosis not present

## 2018-03-25 DIAGNOSIS — I872 Venous insufficiency (chronic) (peripheral): Secondary | ICD-10-CM | POA: Diagnosis not present

## 2018-04-08 DIAGNOSIS — R739 Hyperglycemia, unspecified: Secondary | ICD-10-CM | POA: Diagnosis not present

## 2018-04-08 DIAGNOSIS — E7800 Pure hypercholesterolemia, unspecified: Secondary | ICD-10-CM | POA: Diagnosis not present

## 2018-04-15 DIAGNOSIS — I10 Essential (primary) hypertension: Secondary | ICD-10-CM | POA: Diagnosis not present

## 2018-04-15 DIAGNOSIS — R739 Hyperglycemia, unspecified: Secondary | ICD-10-CM | POA: Diagnosis not present

## 2018-04-15 DIAGNOSIS — E7800 Pure hypercholesterolemia, unspecified: Secondary | ICD-10-CM | POA: Diagnosis not present

## 2018-05-27 DIAGNOSIS — I872 Venous insufficiency (chronic) (peripheral): Secondary | ICD-10-CM | POA: Diagnosis not present

## 2018-09-10 DIAGNOSIS — H26493 Other secondary cataract, bilateral: Secondary | ICD-10-CM | POA: Diagnosis not present

## 2018-09-10 DIAGNOSIS — H35372 Puckering of macula, left eye: Secondary | ICD-10-CM | POA: Diagnosis not present

## 2018-09-10 DIAGNOSIS — H353131 Nonexudative age-related macular degeneration, bilateral, early dry stage: Secondary | ICD-10-CM | POA: Diagnosis not present

## 2018-09-10 DIAGNOSIS — Z961 Presence of intraocular lens: Secondary | ICD-10-CM | POA: Diagnosis not present

## 2019-03-02 DIAGNOSIS — I340 Nonrheumatic mitral (valve) insufficiency: Secondary | ICD-10-CM | POA: Diagnosis not present

## 2019-03-02 DIAGNOSIS — Z23 Encounter for immunization: Secondary | ICD-10-CM | POA: Diagnosis not present

## 2019-03-02 DIAGNOSIS — M8589 Other specified disorders of bone density and structure, multiple sites: Secondary | ICD-10-CM | POA: Diagnosis not present

## 2019-03-02 DIAGNOSIS — I10 Essential (primary) hypertension: Secondary | ICD-10-CM | POA: Diagnosis not present

## 2019-03-02 DIAGNOSIS — Z0000 Encounter for general adult medical examination without abnormal findings: Secondary | ICD-10-CM | POA: Diagnosis not present

## 2019-03-02 DIAGNOSIS — I351 Nonrheumatic aortic (valve) insufficiency: Secondary | ICD-10-CM | POA: Diagnosis not present

## 2019-03-02 DIAGNOSIS — E7800 Pure hypercholesterolemia, unspecified: Secondary | ICD-10-CM | POA: Diagnosis not present

## 2019-03-02 DIAGNOSIS — R739 Hyperglycemia, unspecified: Secondary | ICD-10-CM | POA: Diagnosis not present

## 2019-03-03 DIAGNOSIS — M8580 Other specified disorders of bone density and structure, unspecified site: Secondary | ICD-10-CM | POA: Diagnosis not present

## 2019-05-19 DIAGNOSIS — I4510 Unspecified right bundle-branch block: Secondary | ICD-10-CM | POA: Insufficient documentation

## 2019-05-19 DIAGNOSIS — I340 Nonrheumatic mitral (valve) insufficiency: Secondary | ICD-10-CM | POA: Insufficient documentation

## 2019-05-19 NOTE — Progress Notes (Signed)
Cardiology Office Note   Date:  05/20/2019   ID:  Danny Hudson, DOB 1928-04-19, MRN [id number]  PCP:  Jani Gravel, MD  Cardiologist:   No primary care provider on file. Referring:  Jani Gravel, MD  Chief Complaint  Patient presents with  . Heart Murmur      History of Present Illness: Danny Hudson is a 85 y.o. male who presents for Jani Gravel, MD for evaluation of mitral regurgitation. I saw him previously in 2016 for evaluation of a murmur.   He had moderate MR. He denies any ongoing symptoms.  His daughter who is with him does not notice with his activities that he has any shortness of breath.  She never notices PND or orthopnea.  Is not had palpitations, presyncope or syncope.  He is not had weight gain or edema.  He has had right hip pain which was bothering him recently.  I reviewed some primary care records and do not see any acute complaints of shortness of breath.  He gets around his house and does some ambulation.   Past Medical History:  Diagnosis Date  . ALLERGIC RHINITIS 01/21/2007   Qualifier: Diagnosis of  By: Larose Kells MD, [place] Gout of ankle 03/21/2015  . HEMORRHOIDS, INTERNAL W/O COMPLICATION 0000000   Qualifier: Diagnosis of  By: Larose Kells MD, [place] HYPERTENSION 01/21/2007   Qualifier: Diagnosis of  By: Larose Kells MD, [place]     Past Surgical History:  Procedure Laterality Date  . CATARACT EXTRACTION       Current Outpatient Medications  Medication Sig Dispense Refill  . amLODipine-valsartan (EXFORGE) 10-160 MG tablet Take 1 tablet by mouth daily.    . Cholecalciferol (VITAMIN D3) 50 MCG (2000 UT) capsule Take by mouth.    . simvastatin (ZOCOR) 40 MG tablet Take by mouth.     No current facility-administered medications for this visit.     Allergies:   Patient has no known allergies.    Social History:  The patient  reports that he has never smoked. He has never used smokeless tobacco.   Family History:  The patient's family history includes Sudden death (age  of onset: 99) in his father.    ROS:  Please see the history of present illness.   Otherwise, review of systems are positive for none.   All other systems are reviewed and negative.    PHYSICAL EXAM: VS:  BP (!) 142/76   Pulse 68   Ht 5\' 2"  (1.575 m)   Wt 141 lb (64 kg)   SpO2 96%   BMI 25.79 kg/m  , BMI Body mass index is 25.79 kg/m. GENERAL:  Well appearing HEENT:  Pupils equal round and reactive, fundi not visualized, oral mucosa unremarkable NECK:  No jugular venous distention, waveform within normal limits, carotid upstroke brisk and symmetric, no bruits, no thyromegaly LYMPHATICS:  No cervical, inguinal adenopathy LUNGS: Diffuse bilateral crackles BACK:  No CVA tenderness CHEST:  Unremarkable HEART:  PMI not displaced or sustained,S1 and S2 within normal limits, no S3, no S4, no clicks, no rubs, 2 out of 6 apical late systolic murmur radiating slightly to the axilla, no diastolic murmurs ABD:  Flat, positive bowel sounds normal in frequency in pitch, no bruits, no rebound, no guarding, no midline pulsatile mass, no hepatomegaly, no splenomegaly EXT:  2 plus pulses throughout, no edema, no cyanosis no clubbing SKIN:  No rashes no nodules NEURO:  Cranial nerves II  through XII grossly intact, motor grossly intact throughout [facility]:  Cognitively intact, oriented to person place and time  EKG:  EKG is ordered today. The ekg ordered today demonstrates sinus rhythm, rate 66, right bundle branch block, no acute ST-T wave changes.   Recent Labs: No results found for requested labs within last 8760 hours.    Lipid Panel    Component Value Date/Time   CHOL 183 01/05/2007 0814   TRIG 130 01/05/2007 0814   HDL 37.4 (L) 01/05/2007 0814   CHOLHDL 4.9 CALC 01/05/2007 0814   VLDL 26 01/05/2007 0814   LDLCALC 120 (H) 01/05/2007 0814      Wt Readings from Last 3 Encounters:  05/20/19 141 lb (64 kg)  07/10/15 137 lb 11.2 oz (62.5 kg)  03/21/15 138 lb (62.6 kg)      Other  studies Reviewed: Additional studies/ records that were reviewed today include: Old echo. Review of the above records demonstrates:  Please see elsewhere in the note.     ASSESSMENT AND PLAN:  MR:     He would potentially be a candidate for maybe mitral clipping or repair despite his advanced age if we see any worsening.  He is not having any symptoms.  I will follow-up with an echo however.  RBBB:    This was noted when I saw him in 2016.  He has no syncope or presyncope.  No change in therapy.  HTN:  The blood pressure is at target. No change in medications is indicated. We will continue with therapeutic lifestyle changes (TLC).  HYPOXEMIA: Although the patient does not describe dyspnea he has significant dry crackles on exam.  He had an oxygen saturation of 90% with into the 80s when he ambulated.  I am going to go ahead and get a chest x-ray.  I am also going to check a BNP    Current medicines are reviewed at length with the patient today.  The patient does not have concerns regarding medicines.  The following changes have been made:  no change  Labs/ tests ordered today include:  No orders of the defined types were placed in this encounter.    Disposition:   FU with me in one year.      Signed, Minus Breeding, MD  05/20/2019 2:59 PM    [place]

## 2019-05-20 ENCOUNTER — Other Ambulatory Visit: Payer: Self-pay

## 2019-05-20 ENCOUNTER — Ambulatory Visit (INDEPENDENT_AMBULATORY_CARE_PROVIDER_SITE_OTHER): Payer: Medicare HMO | Admitting: Cardiology

## 2019-05-20 ENCOUNTER — Encounter: Payer: Self-pay | Admitting: Cardiology

## 2019-05-20 ENCOUNTER — Ambulatory Visit
Admission: RE | Admit: 2019-05-20 | Discharge: 2019-05-20 | Disposition: A | Discharge status: Home / Self Care | Payer: Medicare Other | Source: Ambulatory Visit | Attending: Cardiology | Admitting: Cardiology

## 2019-05-20 ENCOUNTER — Encounter (INDEPENDENT_AMBULATORY_CARE_PROVIDER_SITE_OTHER): Payer: Self-pay

## 2019-05-20 VITALS — BP 142/76 | HR 68 | Ht 62.0 in | Wt 141.0 lb

## 2019-05-20 DIAGNOSIS — I10 Essential (primary) hypertension: Secondary | ICD-10-CM

## 2019-05-20 DIAGNOSIS — I340 Nonrheumatic mitral (valve) insufficiency: Secondary | ICD-10-CM

## 2019-05-20 DIAGNOSIS — I4510 Unspecified right bundle-branch block: Secondary | ICD-10-CM

## 2019-05-20 DIAGNOSIS — R0902 Hypoxemia: Secondary | ICD-10-CM | POA: Diagnosis not present

## 2019-05-20 DIAGNOSIS — I517 Cardiomegaly: Secondary | ICD-10-CM | POA: Diagnosis not present

## 2019-05-20 NOTE — Patient Instructions (Signed)
Medication Instructions:  Your physician recommends that you continue on your current medications as directed. Please refer to the Current Medication list given to you today.  If you need a refill on your cardiac medications before your next appointment, please call your pharmacy.   Lab work: BNP If you have labs (blood work) drawn today and your tests are completely normal, you will receive your results only by: [place] (if you have MyChart) OR A paper copy in the mail If you have any lab test that is abnormal or we need to change your treatment, we will call you to review the results.  Testing/Procedures: Chest X-Ray: A chest x-ray takes a picture of the organs and structures inside the chest, including the heart, lungs, and blood vessels. This test can show several things, including, whether the heart is enlarges; whether fluid is building up in the lungs; and whether pacemaker / defibrillator leads are still in place.   Follow-Up: At [facility], you and your health needs are our priority.  As part of our continuing mission to provide you with exceptional heart care, we have created designated Provider Care Teams.  These Care Teams include your primary Cardiologist (physician) and Advanced Practice Providers (APPs -  Physician Assistants and Nurse Practitioners) who all work together to provide you with the care you need, when you need it. You will need a follow up appointment in 12 months.  Please call our office 2 months in advance to schedule this appointment.  You may see Minus Breeding, MD or one of the following Advanced Practice Providers on your designated Care Team:   Rosaria Ferries, PA-C Jory Sims, DNP, ANP

## 2019-05-21 LAB — BRAIN NATRIURETIC PEPTIDE: BNP: 13.5 pg/mL (ref 0.0–100.0)

## 2019-05-25 ENCOUNTER — Telehealth: Payer: Self-pay | Admitting: *Deleted

## 2019-05-25 DIAGNOSIS — I340 Nonrheumatic mitral (valve) insufficiency: Secondary | ICD-10-CM

## 2019-05-25 NOTE — Telephone Encounter (Signed)
Advised patients daughter need for echo and scheduled for next  week

## 2019-06-02 ENCOUNTER — Other Ambulatory Visit: Payer: Self-pay

## 2019-06-02 ENCOUNTER — Ambulatory Visit (HOSPITAL_COMMUNITY): Payer: Medicare HMO | Attending: Cardiovascular Disease

## 2019-06-02 DIAGNOSIS — I340 Nonrheumatic mitral (valve) insufficiency: Secondary | ICD-10-CM | POA: Insufficient documentation

## 2019-06-13 DIAGNOSIS — M549 Dorsalgia, unspecified: Secondary | ICD-10-CM | POA: Diagnosis not present

## 2019-06-13 DIAGNOSIS — Z23 Encounter for immunization: Secondary | ICD-10-CM | POA: Diagnosis not present

## 2019-06-13 DIAGNOSIS — M5431 Sciatica, right side: Secondary | ICD-10-CM | POA: Diagnosis not present

## 2019-06-13 DIAGNOSIS — M47816 Spondylosis without myelopathy or radiculopathy, lumbar region: Secondary | ICD-10-CM | POA: Diagnosis not present

## 2019-06-16 ENCOUNTER — Other Ambulatory Visit: Payer: Self-pay | Admitting: Internal Medicine

## 2019-06-16 DIAGNOSIS — M79604 Pain in right leg: Secondary | ICD-10-CM

## 2019-06-20 DIAGNOSIS — R739 Hyperglycemia, unspecified: Secondary | ICD-10-CM | POA: Diagnosis not present

## 2019-06-20 DIAGNOSIS — I080 Rheumatic disorders of both mitral and aortic valves: Secondary | ICD-10-CM | POA: Diagnosis not present

## 2019-06-20 DIAGNOSIS — I10 Essential (primary) hypertension: Secondary | ICD-10-CM | POA: Diagnosis not present

## 2019-06-20 DIAGNOSIS — Z9181 History of falling: Secondary | ICD-10-CM | POA: Diagnosis not present

## 2019-06-20 DIAGNOSIS — E7800 Pure hypercholesterolemia, unspecified: Secondary | ICD-10-CM | POA: Diagnosis not present

## 2019-06-20 DIAGNOSIS — M5431 Sciatica, right side: Secondary | ICD-10-CM | POA: Diagnosis not present

## 2019-06-20 DIAGNOSIS — H3530 Unspecified macular degeneration: Secondary | ICD-10-CM | POA: Diagnosis not present

## 2019-06-29 ENCOUNTER — Other Ambulatory Visit: Payer: Self-pay

## 2019-06-29 ENCOUNTER — Ambulatory Visit (INDEPENDENT_AMBULATORY_CARE_PROVIDER_SITE_OTHER): Payer: Medicare HMO

## 2019-06-29 DIAGNOSIS — M79604 Pain in right leg: Secondary | ICD-10-CM

## 2019-07-19 DIAGNOSIS — I739 Peripheral vascular disease, unspecified: Secondary | ICD-10-CM | POA: Diagnosis not present

## 2019-07-19 DIAGNOSIS — M5431 Sciatica, right side: Secondary | ICD-10-CM | POA: Diagnosis not present

## 2019-07-20 ENCOUNTER — Telehealth: Payer: Self-pay

## 2019-07-20 NOTE — Telephone Encounter (Signed)
Used interpreter services to call pt to let him know he has an appt with Dr Gwenlyn Found on 11/6 @ 1:30pm for abnormal ABI. No answer, left message will call again later.

## 2019-07-20 NOTE — Telephone Encounter (Signed)
Contacted pt. Spoke with pt daughter who will be bringing pt to office for appt. She states unable to present at 1:30 because she is working. Pt was double-booked at 1:30 pm. Changed appt time to 2:00pm. Confirmed appt date 11/6 and location. She verbalized understanding

## 2019-07-22 ENCOUNTER — Ambulatory Visit: Payer: Medicare HMO | Admitting: Cardiovascular Disease

## 2019-07-22 ENCOUNTER — Other Ambulatory Visit: Payer: Self-pay

## 2019-07-22 ENCOUNTER — Encounter: Payer: Self-pay | Admitting: Cardiovascular Disease

## 2019-07-22 VITALS — BP 116/58 | HR 69 | Temp 97.1°F | Ht 63.0 in | Wt 137.0 lb

## 2019-07-22 DIAGNOSIS — I739 Peripheral vascular disease, unspecified: Secondary | ICD-10-CM

## 2019-07-22 MED ORDER — CILOSTAZOL 50 MG PO TABS: 50.0000 mg | ORAL_TABLET | Freq: Two times a day (BID) | ORAL | 3 refills | Status: DC

## 2019-07-22 NOTE — Assessment & Plan Note (Signed)
Danny Hudson was referred to me by Danny Hudson for peripheral vascular evaluation.  He is an 85 year old married [place] male patient of Danny Hudson with a history of nonrheumatic mitral regurgitation by recent 2D echo performed 06/02/2019.  His other problems include treated hypertension and hyperlipidemia.  He has had right lower extremity lower extremity claudication greater than left with recent segmental pressures performed by Danny Hudson 06/29/2019 revealing a right ABI 0.67 and a left of 0.93.  There is no evidence of critical limb ischemia.  I am going to begin him on Pletal 50 mg p.o. twice daily and get lower extremity arterial Doppler studies to further Characterize the degree of severity of his PAD and location.  I will see him back in 3 months at which time we will decide whether or not he wishes to proceed with the interventional approach.

## 2019-07-22 NOTE — Patient Instructions (Signed)
Medication Instructions:  START Cilostazol (Pletal) 50 mg twice daily STOP the Plavix  *If you need a refill on your cardiac medications before your next appointment, please call your pharmacy*  Lab Work: None ordered If you have labs (blood work) drawn today and your tests are completely normal, you will receive your results only by: [place] Kitchen MyChart Message (if you have MyChart) OR . A paper copy in the mail If you have any lab test that is abnormal or we need to change your treatment, we will call you to review the results.  Testing/Procedures: Your physician has requested that you have a lower extremity arterial duplex. During this test, ultrasound is used to evaluate arterial blood flow in the legs. Allow one hour for this exam. There are no restrictions or special instructions. This will take place at [place], [address].   Follow-Up: At [facility], you and your health needs are our priority.  As part of our continuing mission to provide you with exceptional heart care, we have created designated Provider Care Teams.  These Care Teams include your primary Cardiologist (physician) and Advanced Practice Providers (APPs -  Physician Assistants and Nurse Practitioners) who all work together to provide you with the care you need, when you need it.  Your next appointment:   3 months  The format for your next appointment:   In Person  Provider:   Quay Burow, MD

## 2019-07-22 NOTE — Progress Notes (Signed)
07/22/2019 Danny Hudson   1928/04/30  [id number]  Primary Physician Danny Gravel, MD Primary Cardiologist: Danny Harp MD Danny Hudson, [place]  HPI:  Danny Hudson is a 85 y.o. thin appearing married [place] male father of 16, grandfather of 7 grandchildren is accompanied by his daughter Danny Hudson today.  He is referred by Danny Hudson, his PCP, for evaluation of peripheral arterial disease.  His cardiologist is Dr. Percival Hudson who follows him for moderate mitral regurgitation.  He does have a history of hypertension and hyperlipidemia.  He has had right greater than left lower extremity claudication for the last 6 months which which is somewhat lifestyle limiting.  He had Doppler studies performed 06/29/2019 revealing a right ABI 0.67 in the left of 0.93.  There is no evidence of critical limb ischemia.  He denies chest pain or shortness of breath.   Current Meds  Medication Sig  . amLODipine-valsartan (EXFORGE) 10-160 MG tablet Take 1 tablet by mouth daily.  . Cholecalciferol (VITAMIN D3) 50 MCG (2000 UT) capsule Take by mouth.  . simvastatin (ZOCOR) 40 MG tablet Take 20 mg by mouth daily.      No Known Allergies  Social History   Socioeconomic History  . Marital status: Married    Spouse name: Not on file  . Number of children: 3  . Years of education: Not on file  . Highest education level: Not on file  Occupational History  . Not on file  Social Needs  . Financial resource strain: Not on file  . Food insecurity    Worry: Not on file    Inability: Not on file  . Transportation needs    Medical: Not on file    Non-medical: Not on file  Tobacco Use  . Smoking status: Never Smoker  . Smokeless tobacco: Never Used  Substance and Sexual Activity  . Alcohol use: Not on file  . Drug use: Not on file  . Sexual activity: Not on file  Lifestyle  . Physical activity    Days per week: Not on file    Minutes per session: Not on file  . Stress: Not on file  Relationships  . Social  Herbalist on phone: Not on file    Gets together: Not on file    Attends religious service: Not on file    Active member of club or organization: Not on file    Attends meetings of clubs or organizations: Not on file    Relationship status: Not on file  . Intimate partner violence    Fear of current or ex partner: Not on file    Emotionally abused: Not on file    Physically abused: Not on file    Forced sexual activity: Not on file  Other Topics Concern  . Not on file  Social History Narrative   Exercises routinely.   Lives with daughter.       Review of Systems: General: negative for chills, fever, night sweats or weight changes.  Cardiovascular: negative for chest pain, dyspnea on exertion, edema, orthopnea, palpitations, paroxysmal nocturnal dyspnea or shortness of breath Dermatological: negative for rash Respiratory: negative for cough or wheezing Urologic: negative for hematuria Abdominal: negative for nausea, vomiting, diarrhea, bright red blood per rectum, melena, or hematemesis Neurologic: negative for visual changes, syncope, or dizziness All other systems reviewed and are otherwise negative except as noted above.    Blood pressure (!) 116/58, pulse 69, temperature (!)  97.1 F (36.2 C), height 5\' 3"  (1.6 m), weight 137 lb (62.1 kg).  General appearance: alert and no distress Neck: no adenopathy, no carotid bruit, no JVD, supple, symmetrical, trachea midline and thyroid not enlarged, symmetric, no tenderness/mass/nodules Lungs: clear to auscultation bilaterally Heart: regular rate and rhythm, S1, S2 normal, no murmur, click, rub or gallop Extremities: extremities normal, atraumatic, no cyanosis or edema Pulses: 2+ and symmetric Skin: Skin color, texture, turgor normal. No rashes or lesions Neurologic: Alert and oriented X 3, normal strength and tone. Normal symmetric reflexes. Normal coordination and gait  EKG not performed today  ASSESSMENT AND PLAN:    Peripheral arterial disease [facility]) Danny Hudson was referred to me by Danny Hudson for peripheral vascular evaluation.  He is an 85 year old married [place] male patient of Danny Hudson with a history of nonrheumatic mitral regurgitation by recent 2D echo performed 06/02/2019.  His other problems include treated hypertension and hyperlipidemia.  He has had right lower extremity lower extremity claudication greater than left with recent segmental pressures performed by Danny Hudson 06/29/2019 revealing a right ABI 0.67 and a left of 0.93.  There is no evidence of critical limb ischemia.  I am going to begin him on Pletal 50 mg p.o. twice daily and get lower extremity arterial Doppler studies to further Characterize the degree of severity of his PAD and location.  I will see him back in 3 months at which time we will decide whether or not he wishes to proceed with the interventional approach.      Danny Harp MD FACP,FACC,FAHA, [facility] 07/22/2019 2:51 PM

## 2019-07-25 ENCOUNTER — Other Ambulatory Visit (HOSPITAL_COMMUNITY): Payer: Self-pay | Admitting: Cardiovascular Disease

## 2019-07-25 ENCOUNTER — Ambulatory Visit (HOSPITAL_COMMUNITY)
Admission: RE | Admit: 2019-07-25 | Discharge: 2019-07-25 | Disposition: A | Discharge status: Home / Self Care | Payer: Medicare HMO | Source: Ambulatory Visit | Attending: Cardiology | Admitting: Cardiology

## 2019-07-25 ENCOUNTER — Other Ambulatory Visit: Payer: Self-pay

## 2019-07-25 DIAGNOSIS — I739 Peripheral vascular disease, unspecified: Secondary | ICD-10-CM

## 2019-07-27 ENCOUNTER — Ambulatory Visit: Payer: Medicare HMO | Admitting: Cardiovascular Disease

## 2019-07-27 ENCOUNTER — Encounter: Payer: Self-pay | Admitting: Cardiovascular Disease

## 2019-07-27 ENCOUNTER — Other Ambulatory Visit: Payer: Self-pay

## 2019-07-27 DIAGNOSIS — I739 Peripheral vascular disease, unspecified: Secondary | ICD-10-CM

## 2019-07-27 NOTE — Assessment & Plan Note (Signed)
Danny Hudson returns today for follow-up of his lower extremity arterial Doppler studies performed in the evaluation of lifestyle limiting claudication.  He had Doppler studies performed 07/25/2019 that showed a right ABI 0.75 and a left of 1.05 with a high-frequency signal in his right common iliac artery.  He wishes to proceed with angiography potential endovascular therapy.  Marland Kitchen

## 2019-07-27 NOTE — Patient Instructions (Addendum)
Medication Instructions:  Start taking 81mg  Aspirin Daily.   If you need a refill on your cardiac medications before your next appointment, please call your pharmacy.   Lab work: BMET, CBC If you have labs (blood work) drawn today and your tests are completely normal, you will receive your results only by: [place] (if you have MyChart) OR A paper copy in the mail If you have any lab test that is abnormal or we need to change your treatment, we will call you to review the results.  Testing/Procedures: Your physician has requested that you have a peripheral vascular angiogram. This exam is performed at the hospital. During this exam IV contrast is used to look at arterial blood flow. Please review the information sheet given for details.  Your physician has requested that you have a lower extremity arterial exercise duplex 1 week after procedure. During this test, exercise and ultrasound are used to evaluate arterial blood flow in the legs. Allow one hour for this exam. There are no restrictions or special instructions.   Follow-Up: At [facility], you and your health needs are our priority.  As part of our continuing mission to provide you with exceptional heart care, we have created designated Provider Care Teams.  These Care Teams include your primary Cardiologist (physician) and Advanced Practice Providers (APPs -  Physician Assistants and Nurse Practitioners) who all work together to provide you with the care you need, when you need it. You may see Dr Gwenlyn Found or one of the following Advanced Practice Providers on your designated Care Team:    Kerin Ransom, PA-C  [place], [place]  Coletta Memos, [place]   Your physician wants you to follow-up 2 weeks after procedure.  Any Other Special Instructions Will Be Listed Below (If Applicable).    [place]  [address] Dept: [phone] Loc: [place]  07/27/2019  You are scheduled for a Peripheral Angiogram on Friday, November 30 with Dr. Quay Burow.  1. Please arrive at the [facility] (Main Entrance A) at [facility]: [address] [place],  [address] at 9:30 AM (This time is two hours before your procedure to ensure your preparation). Free valet parking service is available.   Special note: Every effort is made to have your procedure done on time. Please understand that emergencies sometimes delay scheduled procedures.  2. Diet: Do not eat solid foods after midnight.  The patient may have clear liquids until 5am upon the day of the procedure.  3. Labs: You will need to have blood drawn today,  4. Medication instructions in preparation for your procedure:  Start taking 81mg  Aspirin Daily.   On the morning of your procedure, take your Aspirin and any morning medicines NOT listed above.  You may use sips of water.  5. Plan for one night stay--bring personal belongings. 6. Bring a current list of your medications and current insurance cards. 7. You MUST have a responsible person to drive you home. 8. Someone MUST be with you the first 24 hours after you arrive home or your discharge will be delayed. 9. Please wear clothes that are easy to get on and off and wear slip-on shoes.  You will have a covid test on Friday November 27th at 11:15am.  This is located at the [facility]). [place] [address]  Thank you for  allowing [place] to care for you!   -- [place] [facility]

## 2019-07-27 NOTE — H&P (View-Only) (Signed)
Mr Mundine returns today for follow-up of his lower extremity arterial Doppler studies performed in the evaluation of lifestyle limiting claudication.  He had Doppler studies performed 07/25/2019 that showed a right ABI 0.75 and a left of 1.05 with a high-frequency signal in his right common iliac artery.  He wishes to proceed with angiography potential endovascular therapy.     Lorretta Harp, M.D., [place], [facility], [place] [address] [place], [place]  [address]  [phone] 07/27/2019 3:32 PM

## 2019-07-27 NOTE — Progress Notes (Signed)
Danny Hudson returns today for follow-up of his lower extremity arterial Doppler studies performed in the evaluation of lifestyle limiting claudication.  He had Doppler studies performed 07/25/2019 that showed a right ABI 0.75 and a left of 1.05 with a high-frequency signal in his right common iliac artery.  He wishes to proceed with angiography potential endovascular therapy.     Lorretta Harp, M.D., [place], [facility], [place] [address] [place], [place]  [address]  [phone] 07/27/2019 3:32 PM

## 2019-07-28 ENCOUNTER — Encounter: Payer: Self-pay | Admitting: *Deleted

## 2019-07-28 LAB — CBC
Hematocrit: 41 % (ref 37.5–51.0)
Hemoglobin: 13.7 g/dL (ref 13.0–17.7)
MCH: 29.4 pg (ref 26.6–33.0)
MCHC: 33.4 g/dL (ref 31.5–35.7)
MCV: 88 fL (ref 79–97)
Platelets: 161 10*3/uL (ref 150–450)
RBC: 4.66 x10E6/uL (ref 4.14–5.80)
RDW: 13 % (ref 11.6–15.4)
WBC: 9.3 10*3/uL (ref 3.4–10.8)

## 2019-07-28 LAB — BASIC METABOLIC PANEL
BUN/Creatinine Ratio: 14 (ref 10–24)
BUN: 17 mg/dL (ref 8–27)
CO2: 23 mmol/L (ref 20–29)
Calcium: 9.4 mg/dL (ref 8.6–10.2)
Chloride: 102 mmol/L (ref 96–106)
Creatinine, Ser: 1.18 mg/dL (ref 0.76–1.27)
GFR calc Af Amer: 63 mL/min/{1.73_m2} (ref >59)
GFR calc non Af Amer: 54 mL/min/{1.73_m2} — ABNORMAL LOW (ref >59)
Glucose: 112 mg/dL — ABNORMAL HIGH (ref 65–99)
Potassium: 4.5 mmol/L (ref 3.5–5.2)
Sodium: 139 mmol/L (ref 134–144)

## 2019-08-03 ENCOUNTER — Telehealth: Payer: Self-pay | Admitting: Cardiovascular Disease

## 2019-08-03 NOTE — Telephone Encounter (Signed)
Patient's daughter called in regards to her father's upcoming Cath procedure. She would like to know what time her father would be discharged the follow up day. She states she has a schedule conflict.

## 2019-08-03 NOTE — Telephone Encounter (Signed)
Returned call to patient's daughter.She stated father is having a procedure 11/30 with Dr.Berry.She wanted to know what time he would be discharged from hospital the next day.Stated she has a scheduling conflict.Advised she will have to check with his RN at hospital that morning.

## 2019-08-08 ENCOUNTER — Telehealth: Payer: Self-pay | Admitting: Cardiovascular Disease

## 2019-08-08 NOTE — Telephone Encounter (Signed)
Spoke with pt daughter, the patient is having off and on pain in the hip, thigh and groin area.  She reports he has taken alleve and it has eased it some. Okay given for patient to use tylenol or motrin until his procedure Monday as needed.

## 2019-08-08 NOTE — Telephone Encounter (Signed)
Patient has a cath procedure on Monday 08/15/19 with Dr. Gwenlyn Found. Patient's daughter states that he is having pain in his legs and thigh area near his groin. Denies any other symptoms. Patient is asking for Dr. Gwenlyn Found or his nurse to give her a call.

## 2019-08-10 ENCOUNTER — Telehealth: Payer: Self-pay | Admitting: *Deleted

## 2019-08-10 NOTE — Telephone Encounter (Addendum)
Pt contacted pre-abdominal aortogram scheduled at [facility] for: Monday August 15, 2019 11:30 AM Verified arrival time and place: [facility]) at: 9:30 AM   No solid food after midnight prior to cath, clear liquids until 5 AM day of procedure. Contrast allergy: no  Hold: Exforge (amlodipine-valsartan) day before and day of procedure-GFR 54 NSAIDs-day before and day of procedure-GFR 54  Except hold medications AM meds can be  taken pre-cath with sip of water including: ASA 81 mg   Confirmed patient has responsible adult to drive home post procedure and observe 24 hours after arriving home: yes  Currently, due to Covid-19 pandemic, only one support person will be allowed with patient. Must be the same support person for that patient's entire stay, will be screened and required to wear a mask. They will be asked to wait in the waiting room for the duration of the patient's stay.  Patients are required to wear a mask when they enter the hospital.      COVID-19 Pre-Screening Questions:  . In the past 7 to 10 days have you had a cough,  shortness of breath, headache, congestion, fever (100 or greater) body aches, chills, sore throat, or sudden loss of taste or sense of smell? no . Have you been around anyone with known Covid 19? no . Have you been around anyone who is awaiting Covid 19 test results in the past 7 to 10 days? no . Have you been around anyone who has been exposed to Covid 19, or has mentioned symptoms of Covid 19 within the past 7 to 10 days? no  I reviewed procedure/mask/visitor instructions, Covid-19 screening questions with patient's daughter (DPR), she verbalized understanding, thanked me for call.

## 2019-08-12 ENCOUNTER — Other Ambulatory Visit (HOSPITAL_COMMUNITY)
Admission: RE | Admit: 2019-08-12 | Discharge: 2019-08-12 | Disposition: A | Discharge status: Home / Self Care | Payer: Medicare HMO | Source: Ambulatory Visit | Attending: Cardiovascular Disease | Admitting: Cardiovascular Disease

## 2019-08-12 DIAGNOSIS — Z01812 Encounter for preprocedural laboratory examination: Secondary | ICD-10-CM | POA: Diagnosis not present

## 2019-08-12 DIAGNOSIS — Z20828 Contact with and (suspected) exposure to other viral communicable diseases: Secondary | ICD-10-CM | POA: Insufficient documentation

## 2019-08-12 LAB — SARS CORONAVIRUS 2 (TAT 6-24 HRS): SARS Coronavirus 2: NEGATIVE

## 2019-08-15 ENCOUNTER — Other Ambulatory Visit: Payer: Self-pay

## 2019-08-15 ENCOUNTER — Encounter (HOSPITAL_COMMUNITY)
Admission: RE | Disposition: A | Discharge status: Home / Self Care | Payer: Self-pay | Source: Home / Self Care | Attending: Cardiovascular Disease

## 2019-08-15 ENCOUNTER — Ambulatory Visit (HOSPITAL_COMMUNITY)
Admission: RE | Admit: 2019-08-15 | Discharge: 2019-08-16 | Disposition: A | Discharge status: Home / Self Care | Payer: Medicare HMO | Attending: Cardiovascular Disease | Admitting: Cardiovascular Disease

## 2019-08-15 DIAGNOSIS — Z79899 Other long term (current) drug therapy: Secondary | ICD-10-CM | POA: Diagnosis not present

## 2019-08-15 DIAGNOSIS — I340 Nonrheumatic mitral (valve) insufficiency: Secondary | ICD-10-CM | POA: Diagnosis not present

## 2019-08-15 DIAGNOSIS — I10 Essential (primary) hypertension: Secondary | ICD-10-CM | POA: Diagnosis not present

## 2019-08-15 DIAGNOSIS — I70211 Atherosclerosis of native arteries of extremities with intermittent claudication, right leg: Secondary | ICD-10-CM | POA: Diagnosis not present

## 2019-08-15 DIAGNOSIS — Z7982 Long term (current) use of aspirin: Secondary | ICD-10-CM | POA: Insufficient documentation

## 2019-08-15 DIAGNOSIS — Z7902 Long term (current) use of antithrombotics/antiplatelets: Secondary | ICD-10-CM | POA: Insufficient documentation

## 2019-08-15 DIAGNOSIS — E785 Hyperlipidemia, unspecified: Secondary | ICD-10-CM | POA: Diagnosis not present

## 2019-08-15 DIAGNOSIS — I739 Peripheral vascular disease, unspecified: Secondary | ICD-10-CM | POA: Diagnosis present

## 2019-08-15 HISTORY — PX: ABDOMINAL AORTOGRAM W/LOWER EXTREMITY: CATH118223

## 2019-08-15 LAB — POCT ACTIVATED CLOTTING TIME: Activated Clotting Time: 274 seconds

## 2019-08-15 SURGERY — ABDOMINAL AORTOGRAM W/LOWER EXTREMITY
Anesthesia: LOCAL | Laterality: Bilateral

## 2019-08-15 MED ORDER — CLOPIDOGREL BISULFATE 300 MG PO TABS
ORAL_TABLET | ORAL | Status: DC | PRN
  Administered 2019-08-15: 300 mg via ORAL

## 2019-08-15 MED ORDER — HEPARIN (PORCINE) IN NACL 1000-0.9 UT/500ML-% IV SOLN
INTRAVENOUS | Status: DC | PRN
  Administered 2019-08-15 (×2): 500 mL

## 2019-08-15 MED ORDER — ASPIRIN EC 81 MG PO TBEC: 81.0000 mg | DELAYED_RELEASE_TABLET | Freq: Every day | ORAL | Status: DC

## 2019-08-15 MED ORDER — SODIUM CHLORIDE 0.9% FLUSH: 3.0000 mL | INTRAVENOUS | Status: DC | PRN

## 2019-08-15 MED ORDER — ACETAMINOPHEN 325 MG PO TABS
650.0000 mg | ORAL_TABLET | ORAL | Status: DC | PRN
  Administered 2019-08-15: 650 mg via ORAL
  Filled 2019-08-15: qty 2

## 2019-08-15 MED ORDER — ASPIRIN 81 MG PO CHEW: 81.0000 mg | CHEWABLE_TABLET | ORAL | Status: DC

## 2019-08-15 MED ORDER — AMLODIPINE BESYLATE 10 MG PO TABS
10.0000 mg | ORAL_TABLET | Freq: Every day | ORAL | Status: DC
  Administered 2019-08-15 – 2019-08-16 (×2): 10 mg via ORAL
  Filled 2019-08-15 (×2): qty 1

## 2019-08-15 MED ORDER — SODIUM CHLORIDE 0.9% FLUSH
3.0000 mL | Freq: Two times a day (BID) | INTRAVENOUS | Status: DC
  Administered 2019-08-16: 3 mL via INTRAVENOUS

## 2019-08-15 MED ORDER — MORPHINE SULFATE (PF) 2 MG/ML IV SOLN: 2.0000 mg | INTRAVENOUS | Status: DC | PRN

## 2019-08-15 MED ORDER — ASPIRIN EC 81 MG PO TBEC
81.0000 mg | DELAYED_RELEASE_TABLET | Freq: Every day | ORAL | Status: DC
  Administered 2019-08-15 – 2019-08-16 (×2): 81 mg via ORAL
  Filled 2019-08-15 (×2): qty 1

## 2019-08-15 MED ORDER — IRBESARTAN 150 MG PO TABS
150.0000 mg | ORAL_TABLET | Freq: Every day | ORAL | Status: DC
  Administered 2019-08-15 – 2019-08-16 (×2): 150 mg via ORAL
  Filled 2019-08-15 (×2): qty 1

## 2019-08-15 MED ORDER — SODIUM CHLORIDE 0.9 % IV SOLN: 250.0000 mL | INTRAVENOUS | Status: DC | PRN

## 2019-08-15 MED ORDER — CLOPIDOGREL BISULFATE 75 MG PO TABS
75.0000 mg | ORAL_TABLET | Freq: Every day | ORAL | Status: DC
  Administered 2019-08-16: 75 mg via ORAL
  Filled 2019-08-15: qty 1

## 2019-08-15 MED ORDER — LABETALOL HCL 5 MG/ML IV SOLN: 10.0000 mg | INTRAVENOUS | Status: DC | PRN

## 2019-08-15 MED ORDER — LIDOCAINE HCL (PF) 1 % IJ SOLN
INTRAMUSCULAR | Status: AC
  Filled 2019-08-15: qty 30

## 2019-08-15 MED ORDER — HEPARIN SODIUM (PORCINE) 1000 UNIT/ML IJ SOLN
INTRAMUSCULAR | Status: AC
  Filled 2019-08-15: qty 1

## 2019-08-15 MED ORDER — SIMVASTATIN 20 MG PO TABS
20.0000 mg | ORAL_TABLET | Freq: Every day | ORAL | Status: DC
  Administered 2019-08-15: 20 mg via ORAL
  Filled 2019-08-15: qty 1

## 2019-08-15 MED ORDER — IODIXANOL 320 MG/ML IV SOLN
INTRAVENOUS | Status: DC | PRN
  Administered 2019-08-15: 30 mL via INTRA_ARTERIAL

## 2019-08-15 MED ORDER — SODIUM CHLORIDE 0.9 % WEIGHT BASED INFUSION: 1.0000 mL/kg/h | INTRAVENOUS | Status: DC

## 2019-08-15 MED ORDER — HEPARIN (PORCINE) IN NACL 1000-0.9 UT/500ML-% IV SOLN
INTRAVENOUS | Status: AC
  Filled 2019-08-15: qty 1000

## 2019-08-15 MED ORDER — HYDRALAZINE HCL 20 MG/ML IJ SOLN
INTRAMUSCULAR | Status: DC | PRN
  Administered 2019-08-15: 10 mg via INTRAVENOUS

## 2019-08-15 MED ORDER — HEPARIN SODIUM (PORCINE) 1000 UNIT/ML IJ SOLN
INTRAMUSCULAR | Status: DC | PRN
  Administered 2019-08-15: 6000 [IU] via INTRAVENOUS

## 2019-08-15 MED ORDER — ONDANSETRON HCL 4 MG/2ML IJ SOLN: 4.0000 mg | Freq: Four times a day (QID) | INTRAMUSCULAR | Status: DC | PRN

## 2019-08-15 MED ORDER — HYDRALAZINE HCL 20 MG/ML IJ SOLN: 5.0000 mg | INTRAMUSCULAR | Status: DC | PRN

## 2019-08-15 MED ORDER — SODIUM CHLORIDE 0.9 % IV SOLN
INTRAVENOUS | Status: AC
  Administered 2019-08-15: 22:00:00 via INTRAVENOUS

## 2019-08-15 MED ORDER — AMLODIPINE BESYLATE-VALSARTAN 10-160 MG PO TABS: 1.0000 | ORAL_TABLET | Freq: Every day | ORAL | Status: DC

## 2019-08-15 MED ORDER — CLOPIDOGREL BISULFATE 300 MG PO TABS
ORAL_TABLET | ORAL | Status: AC
  Filled 2019-08-15: qty 1

## 2019-08-15 MED ORDER — LIDOCAINE HCL (PF) 1 % IJ SOLN
INTRAMUSCULAR | Status: DC | PRN
  Administered 2019-08-15: 20 mL

## 2019-08-15 MED ORDER — SODIUM CHLORIDE 0.9 % WEIGHT BASED INFUSION
3.0000 mL/kg/h | INTRAVENOUS | Status: DC
  Administered 2019-08-15: 3 mL/kg/h via INTRAVENOUS

## 2019-08-15 MED ORDER — HYDRALAZINE HCL 20 MG/ML IJ SOLN
INTRAMUSCULAR | Status: AC
  Filled 2019-08-15: qty 1

## 2019-08-15 SURGICAL SUPPLY — 20 items
BALLN MUSTANG 4X20X135 (BALLOONS) ×2
BALLOON MUSTANG 4X20X135 (BALLOONS) IMPLANT
CATH ANGIO 5F PIGTAIL 65CM (CATHETERS) ×1 IMPLANT
CLOSURE MYNX CONTROL 6F/7F (Vascular Products) ×1 IMPLANT
KIT ENCORE 26 ADVANTAGE (KITS) ×1 IMPLANT
KIT PV (KITS) ×2 IMPLANT
SHEATH BRITE TIP 7FR 35CM (SHEATH) ×1 IMPLANT
SHEATH PINNACLE 5F 10CM (SHEATH) ×1 IMPLANT
SHEATH PINNACLE 7F 10CM (SHEATH) ×1 IMPLANT
SHEATH PROBE COVER 6X72 (BAG) ×1 IMPLANT
STENT VIABAHN 7X29X80 VBX (Permanent Stent) ×1 IMPLANT
STOPCOCK MORSE 400PSI 3WAY (MISCELLANEOUS) ×1 IMPLANT
SYR MEDRAD MARK 7 150ML (SYRINGE) ×2 IMPLANT
TAPE VIPERTRACK RADIOPAQ (MISCELLANEOUS) IMPLANT
TAPE VIPERTRACK RADIOPAQUE (MISCELLANEOUS) ×2
TRANSDUCER W/STOPCOCK (MISCELLANEOUS) ×2 IMPLANT
TRAY PV CATH (CUSTOM PROCEDURE TRAY) ×2 IMPLANT
TUBING CIL FLEX 10 FLL-RA (TUBING) ×1 IMPLANT
WIRE HITORQ VERSACORE ST 145CM (WIRE) ×1 IMPLANT
WIRE VERSACORE LOC 115CM (WIRE) ×1 IMPLANT

## 2019-08-15 NOTE — Interval H&P Note (Signed)
History and Physical Interval Note:  08/15/2019 1:13 PM  Danny Hudson  has presented today for surgery, with the diagnosis of PAD.  The various methods of treatment have been discussed with the patient and family. After consideration of risks, benefits and other options for treatment, the patient has consented to  Procedure(s): ABDOMINAL AORTOGRAM W/LOWER EXTREMITY (Bilateral) as a surgical intervention.  The patient's history has been reviewed, patient examined, no change in status, stable for surgery.  I have reviewed the patient's chart and labs.  Questions were answered to the patient's satisfaction.     Quay Burow

## 2019-08-15 NOTE — Progress Notes (Signed)
Assisted to bathroom; back to stretcher. Tolerated activity well. Rt groin level 0

## 2019-08-16 ENCOUNTER — Encounter (HOSPITAL_COMMUNITY): Payer: Self-pay | Admitting: Cardiovascular Disease

## 2019-08-16 DIAGNOSIS — I739 Peripheral vascular disease, unspecified: Secondary | ICD-10-CM | POA: Diagnosis not present

## 2019-08-16 MED ORDER — CLOPIDOGREL BISULFATE 75 MG PO TABS: 75.0000 mg | ORAL_TABLET | Freq: Every day | ORAL | 1 refills | Status: DC

## 2019-08-16 MED FILL — CLOPIDOGREL 75 MG TABLET: 75 | 30 days supply | Qty: 30 | Fill #0

## 2019-08-16 NOTE — Progress Notes (Addendum)
Patient's daughter [name] at bedside.  She states she would like to get 30 day prescriptions from [facility] but would like subsequent prescriptions sent to [facility].  Reino Bellis, NP notified and aware.     IVs and Telemetry removed.  Discharge instructions reviewed with and given to patient's daughter.

## 2019-08-16 NOTE — Discharge Summary (Signed)
Discharge Summary    Patient ID: Danny Hudson,  MRN: [id number], DOB/AGE: November 29, 1927 85 y.o.  Admit date: 08/15/2019 Discharge date: 08/16/2019  Primary Care Provider: Jani Gravel Primary Cardiologist: Quay Burow, MD  Discharge Diagnoses    Active Problems:   Peripheral arterial disease [facility])   Claudication in peripheral vascular disease ([place])   Allergies No Known Allergies  Diagnostic Studies/Procedures    PV angiogram: 08/15/19  Procedures Performed:               1.  Ultrasound-guided right common femoral access               2.  Abdominal aortogram/bilateral iliac angiogram               3.  PTA and covered stenting (VBX stent) right common iliac artery               4.  Right common femoral angiogram/Mynx closure  Final Impression: Successful right common iliac artery VBX covered stenting the setting of lifestyle limiting claudication.  The patient did receive 300 mg of p.o. Plavix.  His right common femoral arterial puncture site was successfully sealed using a Mynx closure device.  He will be gently hydrated and discharged home in the morning (keeping because of language Christa See and age).  He left lab in stable condition.  Quay Burow. MD, [facility] 08/15/2019 2:31 PM  _____________   History of Present Illness     Danny Hudson is a 85 y.o. thin appearing married [place] who was referred by Dr. Maudie Mercury, his PCP, for evaluation of peripheral arterial disease.  His cardiologist is Dr. Percival Spanish who follows him for moderate mitral regurgitation.  He does have a history of hypertension and hyperlipidemia.  He has had right greater than left lower extremity claudication for the last 6 months which which was somewhat lifestyle limiting.  He had Doppler studies performed 06/29/2019 revealing a right ABI 0.67 in the left of 0.93.  There was no evidence of critical limb ischemia.  Given his symptoms and ABI he was set up for outpatient PV angiogram.   Hospital Course       Underwent successful right common iliac artery VBX covered stenting the setting of lifestyle limiting claudication. Plan for DAPT with ASA/plavix. No complications noted overnight. Mynx closure device used post procedure. Ambulated without complications. Interpreter device used to review medications and post cath instructions. Educated by PharmD prior to discharge. Continued on home medications without change.   General: Well developed, well nourished, male appearing in no acute distress. Head: Normocephalic, atraumatic.  Neck: Supple without bruits, JVD. Lungs:  Resp regular and unlabored, CTA. Heart: RRR, S1, S2, no S3, S4, or murmur; no rub. Abdomen: Soft, non-tender, non-distended with normoactive bowel sounds. No hepatomegaly. No rebound/guarding. No obvious abdominal masses. Extremities: No clubbing, cyanosis, edema. Distal pedal pulses are 2+ bilaterally. Right femoral cath site stable small amount of bruising but no hematoma. Neuro: Alert and oriented X 3. Moves all extremities spontaneously. Psych: Normal affect.  Danny Hudson was seen by Dr. Martinique and determined stable for discharge home. Follow up in the office has been arranged. Medications are listed below.   _____________  Discharge Vitals Blood pressure (!) 145/61, pulse 70, temperature 98.2 F (36.8 C), temperature source Oral, resp. rate 19, height 5\' 2"  (1.575 m), weight 60.2 kg, SpO2 98 %.  Filed Weights   08/15/19 1006 08/16/19 0605  Weight: 60.3 kg 60.2 kg  Labs & Radiologic Studies    CBC No results for input(s): WBC, NEUTROABS, HGB, HCT, MCV, PLT in the last 72 hours. Basic Metabolic Panel No results for input(s): NA, K, CL, CO2, GLUCOSE, BUN, CREATININE, CALCIUM, MG, PHOS in the last 72 hours. Liver Function Tests No results for input(s): AST, ALT, ALKPHOS, BILITOT, PROT, ALBUMIN in the last 72 hours. No results for input(s): LIPASE, AMYLASE in the last 72 hours. Cardiac Enzymes No results for input(s):  CKTOTAL, CKMB, CKMBINDEX, TROPONINI in the last 72 hours. BNP Invalid input(s): POCBNP D-Dimer No results for input(s): DDIMER in the last 72 hours. Hemoglobin A1C No results for input(s): HGBA1C in the last 72 hours. Fasting Lipid Panel No results for input(s): CHOL, HDL, LDLCALC, TRIG, CHOLHDL, LDLDIRECT in the last 72 hours. Thyroid Function Tests No results for input(s): TSH, T4TOTAL, T3FREE, THYROIDAB in the last 72 hours.  Invalid input(s): FREET3 _____________  Vas [place] Abi With/wo Tbi  Result Date: 07/26/2019 LOWER EXTREMITY DOPPLER STUDY Indications: Claudication, peripheral artery disease, and Patient has had right              hip pain going down to calf after walking about 10 minutes. The              patients daughter stated he has been complaining of this pain since              July 2020. High Risk Factors: Hypertension, no history of smoking.  Comparison Study: Outside ABI done 06/29/2019 at [place] showed                   right .67 and left .93. Performing Technologist: Salvadore Dom RVT, RDCS (AE), RDMS  Examination Guidelines: A complete evaluation includes at minimum, Doppler waveform signals and systolic blood pressure reading at the level of bilateral brachial, anterior tibial, and posterior tibial arteries, when vessel segments are accessible. Bilateral testing is considered an integral part of a complete examination. Photoelectric Plethysmograph (PPG) waveforms and toe systolic pressure readings are included as required and additional duplex testing as needed. Limited examinations for reoccurring indications may be performed as noted.  ABI Findings: +---------+------------------+-----+----------+--------+  Right     Rt Pressure (mmHg) Index Waveform   Comment   +---------+------------------+-----+----------+--------+  Brachial  140                                           +---------+------------------+-----+----------+--------+  ATA       95                  0.68  monophasic           +---------+------------------+-----+----------+--------+  PTA       105                0.75  monophasic           +---------+------------------+-----+----------+--------+  PERO      82                 0.59  monophasic           +---------+------------------+-----+----------+--------+  Great Toe 83                 0.59  Abnormal             +---------+------------------+-----+----------+--------+ +---------+------------------+-----+--------+-------+  Left      Lt Pressure (mmHg) Index  Waveform Comment  +---------+------------------+-----+--------+-------+  Brachial  127                                        +---------+------------------+-----+--------+-------+  ATA       127                0.91  biphasic          +---------+------------------+-----+--------+-------+  PTA       147                1.05  biphasic          +---------+------------------+-----+--------+-------+  PERO      122                0.87  biphasic          +---------+------------------+-----+--------+-------+  Great Toe 81                 0.58  Abnormal          +---------+------------------+-----+--------+-------+ +-------+-----------+-----------+------------+------------+  ABI/TBI Today's ABI Today's TBI Previous ABI Previous TBI  +-------+-----------+-----------+------------+------------+  Right   .75         .59                                    +-------+-----------+-----------+------------+------------+  Left    1.05        .58                                    +-------+-----------+-----------+------------+------------+  Summary: Right: Resting right ankle-brachial index indicates moderate right lower extremity arterial disease. The right toe-brachial index is abnormal. Left: The left toe-brachial index is abnormal. Although ankle brachial indices are within normal limits (0.95-1.29), arterial Doppler waveforms at the ankle suggest some component of arterial occlusive disease.  *See table(s) above for  measurements and observations.  Vascular consult recommended. Electronically signed by Carlyle Dolly MD on 07/26/2019 at 11:37:43 AM.    Final    Vas [place] Lower Extremity Arterial Duplex  Result Date: 07/26/2019 LOWER EXTREMITY ARTERIAL DUPLEX STUDY Indications: Claudication, and Patient has had right hip pain going down to calf              after walking about 10 minutes. The patients daughter stated he has              been complaining of this pain since July 2020. High Risk Factors: Hypertension, no history of smoking.  Current ABI: Right .75 Left 1.05 Comparison Study: None Performing Technologist: Alecia Mackin RVT, RDCS (AE), RDMS  Examination Guidelines: A complete evaluation includes B-mode imaging, spectral Doppler, color Doppler, and power Doppler as needed of all accessible portions of each vessel. Bilateral testing is considered an integral part of a complete examination. Limited examinations for reoccurring indications may be performed as noted.  +-----------+--------+-----+---------------+----------+---------------+  RIGHT       PSV cm/s Ratio Stenosis        Waveform   Comments         +-----------+--------+-----+---------------+----------+---------------+  CIA Prox    440            75-99% stenosis monophasic                  +-----------+--------+-----+---------------+----------+---------------+  CIA Mid                                               overlying bowel  +-----------+--------+-----+---------------+----------+---------------+  CIA Distal                                            overlying bowel  +-----------+--------+-----+---------------+----------+---------------+  EIA Prox    160                            monophasic                  +-----------+--------+-----+---------------+----------+---------------+  EIA Mid     65                             biphasic                    +-----------+--------+-----+---------------+----------+---------------+  EIA Distal  76                              biphasic                    +-----------+--------+-----+---------------+----------+---------------+  CFA Prox    68                                        barely biphasic  +-----------+--------+-----+---------------+----------+---------------+  CFA Distal  37                                        barely biphasic  +-----------+--------+-----+---------------+----------+---------------+  DFA         45                             monophasic                  +-----------+--------+-----+---------------+----------+---------------+  SFA Prox    50                             monophasic                  +-----------+--------+-----+---------------+----------+---------------+  SFA Mid     60                             monophasic                  +-----------+--------+-----+---------------+----------+---------------+  SFA Distal  62                             monophasic                  +-----------+--------+-----+---------------+----------+---------------+  POP Prox    53  monophasic                  +-----------+--------+-----+---------------+----------+---------------+  POP Distal  45                             monophasic                  +-----------+--------+-----+---------------+----------+---------------+  TP Trunk    35                             monophasic                  +-----------+--------+-----+---------------+----------+---------------+  ATA Prox    22                             monophasic                  +-----------+--------+-----+---------------+----------+---------------+  ATA Mid     27                             monophasic                  +-----------+--------+-----+---------------+----------+---------------+  ATA Distal  23                             monophasic                  +-----------+--------+-----+---------------+----------+---------------+  PTA Prox    22                             monophasic                   +-----------+--------+-----+---------------+----------+---------------+  PTA Mid     47                             monophasic                  +-----------+--------+-----+---------------+----------+---------------+  PTA Distal  38                             monophasic                  +-----------+--------+-----+---------------+----------+---------------+  PERO Prox   29                             monophasic                  +-----------+--------+-----+---------------+----------+---------------+  PERO Mid    33                             monophasic                  +-----------+--------+-----+---------------+----------+---------------+  PERO Distal 12                             monophasic                  +-----------+--------+-----+---------------+----------+---------------+  DP                                                                     +-----------+--------+-----+---------------+----------+---------------+ A focal velocity elevation of 440 cm/s was obtained at RT CIA OSTIUM with a VR of 6.0. Findings are characteristic of 75-99% stenosis. Very limited exam due to overlying exam and patient discomfort.  +-----------+--------+-----+---------------+----------------+--------+  LEFT        PSV cm/s Ratio Stenosis        Waveform         Comments  +-----------+--------+-----+---------------+----------------+--------+  CIA Prox    190            30-49% stenosis biphasic                   +-----------+--------+-----+---------------+----------------+--------+  CIA Mid     157                            biphasic                   +-----------+--------+-----+---------------+----------------+--------+  EIA Prox    177                            biphasic                   +-----------+--------+-----+---------------+----------------+--------+  EIA Mid     114                            biphasic                   +-----------+--------+-----+---------------+----------------+--------+  EIA Distal  138                             biphasic                   +-----------+--------+-----+---------------+----------------+--------+  CFA Prox    133                            biphasic                   +-----------+--------+-----+---------------+----------------+--------+  CFA Distal  91                             biphasic                   +-----------+--------+-----+---------------+----------------+--------+  DFA         115                            barely triphasic           +-----------+--------+-----+---------------+----------------+--------+  SFA Prox    82                             biphasic                   +-----------+--------+-----+---------------+----------------+--------+  SFA Mid     140            30-49% stenosis biphasic                   +-----------+--------+-----+---------------+----------------+--------+  SFA Distal  134                            biphasic                   +-----------+--------+-----+---------------+----------------+--------+  POP Prox    86                             biphasic                   +-----------+--------+-----+---------------+----------------+--------+  POP Distal  77                             biphasic                   +-----------+--------+-----+---------------+----------------+--------+  TP Trunk    90                             biphasic                   +-----------+--------+-----+---------------+----------------+--------+  ATA Prox    51                             biphasic                   +-----------+--------+-----+---------------+----------------+--------+  ATA Mid     41                             biphasic                   +-----------+--------+-----+---------------+----------------+--------+  ATA Distal  48                             biphasic                   +-----------+--------+-----+---------------+----------------+--------+  PTA Prox    62                             biphasic                    +-----------+--------+-----+---------------+----------------+--------+  PTA Mid     122            30-49% stenosis biphasic                   +-----------+--------+-----+---------------+----------------+--------+  PTA Distal  56                             biphasic                   +-----------+--------+-----+---------------+----------------+--------+  PERO Prox   48  biphasic                   +-----------+--------+-----+---------------+----------------+--------+  PERO Mid    47                             biphasic                   +-----------+--------+-----+---------------+----------------+--------+  PERO Distal 38                             biphasic                   +-----------+--------+-----+---------------+----------------+--------+  DP                                                                    +-----------+--------+-----+---------------+----------------+--------+ A focal velocity elevation of 140 cm/s was obtained at PRX/MID SFA with a VR of 1.6. Findings are characteristic of 30-49% stenosis. A 2nd focal velocity elevation was visualized, measuring 122 cm/s at MID PTA with a VR of 1.97. Findings are characteristic of 30-49% stenosis. A 3rd focal velocity elevation was visualized, measuring 190 cm/s at CIA PRX with a VR of 2.60. Findings are characteristic of 30-49% stenosis.  Aorta: +------+-------+----------+----------+--------+--------+-----+         AP (cm) Trans (cm) PSV (cm/s) Waveform Thrombus Shape  +------+-------+----------+----------+--------+--------+-----+  Distal 1.50    1.80       73         biphasic                 +------+-------+----------+----------+--------+--------+-----+ Limited aortoiliac exam due to non-NPO status (late afternoon appointment), overlying bowel gas, patient discomfort , and obesity.  Summary: Right: 75-99% stenosis noted in the aortoiliac segment. Atherosclerosis seen in distal aorta ,iliac arteries and leg arteries. Left: 30-49%  stenosis noted in the aortoiliac segment. 30-49% stenosis noted in the superficial femoral artery. 30-49% stenosis noted in the posterior tibial artery. Atherosclerosis seen in distal aorta, iliac arteries and leg arteries.  See table(s) above for measurements and observations.  Patient is scheduled to see Dr. Gwenlyn Found 07/27/2019.  Vascular consult recommended. Electronically signed by Carlyle Dolly MD on 07/26/2019 at 11:57:12 AM.    Final    Disposition   Pt is being discharged home today in good condition.  Follow-up Plans & Appointments    Follow-up Information    [facility] Follow up on 08/22/2019.   Specialty: Cardiology Why: at 2pm for your follow up dopplers Contact information: [address] [place] [phone]       Lorretta Harp, MD Follow up on 08/30/2019.   Specialties: Cardiology, Radiology Why: at 3pm for your follow up appt.  Contact information: [address] [place] [address] [phone]          Discharge Instructions    Diet - low sodium heart healthy   Complete by: As directed    Discharge instructions   Complete by: As directed    Groin Site Care Refer to this sheet in the next few weeks. These instructions provide you with information on caring for yourself after  your procedure. Your caregiver may also give you more specific instructions. Your treatment has been planned according to current medical practices, but problems sometimes occur. Call your caregiver if you have any problems or questions after your procedure. HOME CARE INSTRUCTIONS You may shower 24 hours after the procedure. Remove the bandage (dressing) and gently wash the site with plain soap and water. Gently pat the site dry.  Do not apply powder or lotion to the site.  Do not sit in a bathtub, swimming pool, or whirlpool for 5 to 7 days.  No bending, squatting, or lifting anything over 10 pounds (4.5 kg) as  directed by your caregiver.  Inspect the site at least twice daily.  Do not drive home if you are discharged the same day of the procedure. Have someone else drive you.  You may drive 24 hours after the procedure unless otherwise instructed by your caregiver.  What to expect: Any bruising will usually fade within 1 to 2 weeks.  Blood that collects in the tissue (hematoma) may be painful to the touch. It should usually decrease in size and tenderness within 1 to 2 weeks.  SEEK IMMEDIATE MEDICAL CARE IF: You have unusual pain at the groin site or down the affected leg.  You have redness, warmth, swelling, or pain at the groin site.  You have drainage (other than a small amount of blood on the dressing).  You have chills.  You have a fever or persistent symptoms for more than 72 hours.  You have a fever and your symptoms suddenly get worse.  Your leg becomes pale, cool, tingly, or numb.  You have heavy bleeding from the site. Hold pressure on the site. Marland Kitchen  PLEASE DO NOT MISS ANY DOSES OF YOUR PLAVIX!!!!! Also keep a log of you blood pressures and bring back to your follow up appt. Please call the office with any questions.   Patients taking blood thinners should generally stay away from medicines like ibuprofen, Advil, Motrin, naproxen, and Aleve due to risk of stomach bleeding. You may take Tylenol as directed or talk to your primary doctor about alternatives.   Increase activity slowly   Complete by: As directed        Discharge Medications     Medication List    TAKE these medications   amLODipine-valsartan 10-160 MG tablet Commonly known as: EXFORGE Take 1 tablet by mouth daily.   aspirin EC 81 MG tablet Take 81 mg by mouth at bedtime.   clopidogrel 75 MG tablet Commonly known as: PLAVIX Take 1 tablet (75 mg total) by mouth daily with breakfast.   simvastatin 40 MG tablet Commonly known as: ZOCOR Take 20 mg by mouth at bedtime.   Vitamin D3 50 MCG (2000 UT)  capsule Take 2,000 Units by mouth daily.       No                               Did the patient have a percutaneous coronary intervention (stent / angioplasty)?:  No.      Outstanding Labs/Studies   Follow Dopplers  Duration of Discharge Encounter   Greater than 30 minutes including physician time.  Signed, Reino Bellis NP-C 08/16/2019, 10:46 AM

## 2019-08-16 NOTE — Discharge Instructions (Addendum)
Femoral Site Care °This sheet gives you information about how to care for yourself after your procedure. Your health care provider may also give you more specific instructions. If you have problems or questions, contact your health care provider. °What can I expect after the procedure? °After the procedure, it is common to have: °· Bruising that usually fades within 1-2 weeks. °· Tenderness at the site. °Follow these instructions at home: °Wound care °· Follow instructions from your health care provider about how to take care of your insertion site. Make sure you: °? Wash your hands with soap and water before you change your bandage (dressing). If soap and water are not available, use hand sanitizer. °? Change your dressing as told by your health care provider. °? Leave stitches (sutures), skin glue, or adhesive strips in place. These skin closures may need to stay in place for 2 weeks or longer. If adhesive strip edges start to loosen and curl up, you may trim the loose edges. Do not remove adhesive strips completely unless your health care provider tells you to do that. °· Do not take baths, swim, or use a hot tub until your health care provider approves. °· You may shower 24-48 hours after the procedure or as told by your health care provider. °? Gently wash the site with plain soap and water. °? Pat the area dry with a clean towel. °? Do not rub the site. This may cause bleeding. °· Do not apply powder or lotion to the site. Keep the site clean and dry. °· Check your femoral site every day for signs of infection. Check for: °? Redness, swelling, or pain. °? Fluid or blood. °? Warmth. °? Pus or a bad smell. °Activity °· For the first 2-3 days after your procedure, or as long as directed: °? Avoid climbing stairs as much as possible. °? Do not squat. °· Do not lift anything that is heavier than 10 lb (4.5 kg), or the limit that you are told, until your health care provider says that it is safe. °· Rest as  directed. °? Avoid sitting for a long time without moving. Get up to take short walks every 1-2 hours. °· Do not drive for 24 hours if you were given a medicine to help you relax (sedative). °General instructions °· Take over-the-counter and prescription medicines only as told by your health care provider. °· Keep all follow-up visits as told by your health care provider. This is important. °Contact a health care provider if you have: °· A fever or chills. °· You have redness, swelling, or pain around your insertion site. °Get help right away if: °· The catheter insertion area swells very fast. °· You pass out. °· You suddenly start to sweat or your skin gets clammy. °· The catheter insertion area is bleeding, and the bleeding does not stop when you hold steady pressure on the area. °· The area near or just beyond the catheter insertion site becomes pale, cool, tingly, or numb. °These symptoms may represent a serious problem that is an emergency. Do not wait to see if the symptoms will go away. Get medical help right away. Call your local emergency services (911 in the [place] Do not drive yourself to the hospital. °Summary °· After the procedure, it is common to have bruising that usually fades within 1-2 weeks. °· Check your femoral site every day for signs of infection. °· Do not lift anything that is heavier than 10 lb (4.5 kg), or the   limit that you are told, until your health care provider says that it is safe. This information is not intended to replace advice given to you by your health care provider. Make sure you discuss any questions you have with your health care provider. Document Released: 05/05/2014 Document Revised: 09/14/2017 Document Reviewed: 09/14/2017 Elsevier Patient Education  2020 [place] about your medication: Plavix (anti-platelet agent)  Generic Name (Brand): clopidogrel (Plavix), once daily medication  PURPOSE: You are taking this medication along with  aspirin to lower your chance of having a heart attack, stroke, or blood clots in your stent. These can be fatal. Plavix and aspirin help prevent platelets from sticking together and forming a clot that can block an artery or your stent.   Common SIDE EFFECTS you may experience include: bruising or bleeding more easily, shortness of breath  Do not stop taking PLAVIX without talking to the doctor who prescribes it for you. People who are treated with a stent and stop taking Plavix too soon, have a higher risk of getting a blood clot in the stent, having a heart attack, or dying. If you stop Plavix because of bleeding, or for other reasons, your risk of a heart attack or stroke may increase.   Tell all of your doctors and dentists that you are taking Plavix. They should talk to the doctor who prescribed plavix for you before you have any surgery or invasive procedure.   Contact your health care provider if you experience: severe or uncontrollable bleeding, pink/red/brown urine, vomiting blood or vomit that looks like "coffee grounds", red or black stools (looks like tar), coughing up blood or blood clots ----------------------------------------------------------------------------------------------------------------------    Heart-Healthy Eating Plan Heart-healthy meal planning includes:  Eating less unhealthy fats.  Eating more healthy fats.  Making other changes in your diet. Talk with your doctor or a diet specialist (dietitian) to create an eating plan that is right for you. What is my plan? Your doctor may recommend an eating plan that includes:  Total fat: ______% or less of total calories a day.  Saturated fat: ______% or less of total calories a day.  Cholesterol: less than _________mg a day. What are tips for following this plan? Cooking Avoid frying your food. Try to bake, boil, grill, or broil it instead. You can also reduce fat by:  Removing the skin from  poultry.  Removing all visible fats from meats.  Steaming vegetables in water or broth. Meal planning   At meals, divide your plate into four equal parts: ? Fill one-half of your plate with vegetables and green salads. ? Fill one-fourth of your plate with whole grains. ? Fill one-fourth of your plate with lean protein foods.  Eat 4-5 servings of vegetables per day. A serving of vegetables is: ? 1 cup of raw or cooked vegetables. ? 2 cups of raw leafy greens.  Eat 4-5 servings of fruit per day. A serving of fruit is: ? 1 medium whole fruit. ?  cup of dried fruit. ?  cup of fresh, frozen, or canned fruit. ?  cup of 100% fruit juice.  Eat more foods that have soluble fiber. These are apples, broccoli, carrots, beans, peas, and barley. Try to get 20-30 g of fiber per day.  Eat 4-5 servings of nuts, legumes, and seeds per week: ? 1 serving of dried beans or legumes equals  cup after being cooked. ? 1 serving of nuts is  cup. ? 1 serving of seeds equals 1  tablespoon. General information  Eat more home-cooked food. Eat less restaurant, buffet, and fast food.  Limit or avoid alcohol.  Limit foods that are high in starch and sugar.  Avoid fried foods.  Lose weight if you are overweight.  Keep track of how much salt (sodium) you eat. This is important if you have high blood pressure. Ask your doctor to tell you more about this.  Try to add vegetarian meals each week. Fats  Choose healthy fats. These include olive oil and canola oil, flaxseeds, walnuts, almonds, and seeds.  Eat more omega-3 fats. These include salmon, mackerel, sardines, tuna, flaxseed oil, and ground flaxseeds. Try to eat fish at least 2 times each week.  Check food labels. Avoid foods with trans fats or high amounts of saturated fat.  Limit saturated fats. ? These are often found in animal products, such as meats, butter, and cream. ? These are also found in plant foods, such as palm oil, palm kernel  oil, and coconut oil.  Avoid foods with partially hydrogenated oils in them. These have trans fats. Examples are stick margarine, some tub margarines, cookies, crackers, and other baked goods. What foods can I eat? Fruits All fresh, canned (in natural juice), or frozen fruits. Vegetables Fresh or frozen vegetables (raw, steamed, roasted, or grilled). Green salads. Grains Most grains. Choose whole wheat and whole grains most of the time. Rice and pasta, including brown rice and pastas made with whole wheat. Meats and other proteins Lean, well-trimmed beef, veal, pork, and lamb. Chicken and [place] without skin. All fish and shellfish. Wild duck, rabbit, pheasant, and venison. Egg whites or low-cholesterol egg substitutes. Dried beans, peas, lentils, and tofu. Seeds and most nuts. Dairy Low-fat or nonfat cheeses, including ricotta and mozzarella. Skim or 1% milk that is liquid, powdered, or evaporated. Buttermilk that is made with low-fat milk. Nonfat or low-fat yogurt. Fats and oils Non-hydrogenated (trans-free) margarines. Vegetable oils, including soybean, sesame, sunflower, olive, peanut, safflower, corn, canola, and cottonseed. Salad dressings or mayonnaise made with a vegetable oil. Beverages Mineral water. Coffee and tea. Diet carbonated beverages. Sweets and desserts Sherbet, gelatin, and fruit ice. Small amounts of dark chocolate. Limit all sweets and desserts. Seasonings and condiments All seasonings and condiments. The items listed above may not be a complete list of foods and drinks you can eat. Contact a dietitian for more options. What foods should I avoid? Fruits Canned fruit in heavy syrup. Fruit in cream or butter sauce. Fried fruit. Limit coconut. Vegetables Vegetables cooked in cheese, cream, or butter sauce. Fried vegetables. Grains Breads that are made with saturated or trans fats, oils, or whole milk. Croissants. Sweet rolls. Donuts. High-fat crackers, such as cheese  crackers. Meats and other proteins Fatty meats, such as hot dogs, ribs, sausage, bacon, rib-eye roast or steak. High-fat deli meats, such as salami and bologna. Caviar. Domestic duck and goose. Organ meats, such as liver. Dairy Cream, sour cream, cream cheese, and creamed cottage cheese. Whole-milk cheeses. Whole or 2% milk that is liquid, evaporated, or condensed. Whole buttermilk. Cream sauce or high-fat cheese sauce. Yogurt that is made from whole milk. Fats and oils Meat fat, or shortening. Cocoa butter, hydrogenated oils, palm oil, coconut oil, palm kernel oil. Solid fats and shortenings, including bacon fat, salt pork, lard, and butter. Nondairy cream substitutes. Salad dressings with cheese or sour cream. Beverages Regular sodas and juice drinks with added sugar. Sweets and desserts Frosting. Pudding. Cookies. Cakes. Pies. Milk chocolate or white chocolate. Buttered syrups.  Full-fat ice cream or ice cream drinks. The items listed above may not be a complete list of foods and drinks to avoid. Contact a dietitian for more information. Summary  Heart-healthy meal planning includes eating less unhealthy fats, eating more healthy fats, and making other changes in your diet.  Eat a balanced diet. This includes fruits and vegetables, low-fat or nonfat dairy, lean protein, nuts and legumes, whole grains, and heart-healthy oils and fats. This information is not intended to replace advice given to you by your health care provider. Make sure you discuss any questions you have with your health care provider. Document Released: 03/02/2012 Document Revised: 11/05/2017 Document Reviewed: 10/09/2017 Elsevier Patient Education  2020 [employer].

## 2019-08-17 ENCOUNTER — Other Ambulatory Visit: Payer: Self-pay | Admitting: Cardiovascular Disease

## 2019-08-17 DIAGNOSIS — I739 Peripheral vascular disease, unspecified: Secondary | ICD-10-CM

## 2019-08-17 DIAGNOSIS — Z95828 Presence of other vascular implants and grafts: Secondary | ICD-10-CM

## 2019-08-22 ENCOUNTER — Ambulatory Visit (HOSPITAL_COMMUNITY)
Admission: RE | Admit: 2019-08-22 | Discharge: 2019-08-22 | Disposition: A | Discharge status: Home / Self Care | Payer: Medicare HMO | Source: Ambulatory Visit | Attending: Cardiology | Admitting: Cardiology

## 2019-08-22 ENCOUNTER — Ambulatory Visit (HOSPITAL_BASED_OUTPATIENT_CLINIC_OR_DEPARTMENT_OTHER)
Admission: RE | Admit: 2019-08-22 | Discharge: 2019-08-22 | Disposition: A | Discharge status: Home / Self Care | Payer: Medicare HMO | Source: Ambulatory Visit | Attending: Cardiology | Admitting: Cardiology

## 2019-08-22 ENCOUNTER — Other Ambulatory Visit: Payer: Self-pay

## 2019-08-22 ENCOUNTER — Encounter (HOSPITAL_COMMUNITY): Payer: Medicare HMO

## 2019-08-22 DIAGNOSIS — I739 Peripheral vascular disease, unspecified: Secondary | ICD-10-CM | POA: Diagnosis not present

## 2019-08-22 DIAGNOSIS — Z95828 Presence of other vascular implants and grafts: Secondary | ICD-10-CM | POA: Insufficient documentation

## 2019-08-23 DIAGNOSIS — I739 Peripheral vascular disease, unspecified: Secondary | ICD-10-CM

## 2019-08-23 DIAGNOSIS — Z95828 Presence of other vascular implants and grafts: Secondary | ICD-10-CM

## 2019-08-24 ENCOUNTER — Other Ambulatory Visit: Payer: Self-pay | Admitting: Cardiovascular Disease

## 2019-08-24 MED ORDER — CLOPIDOGREL BISULFATE 75 MG PO TABS: 75.0000 mg | ORAL_TABLET | Freq: Every day | ORAL | 3 refills | Status: DC

## 2019-08-24 NOTE — Telephone Encounter (Signed)
Rx(s) sent to pharmacy electronically. Patients daughter, Corbin Ade, per dpr, has been notified.

## 2019-08-24 NOTE — Telephone Encounter (Signed)
°*  STAT* If patient is at the pharmacy, call can be transferred to refill team.   1. Which medications need to be refilled? (please list name of each medication and dose if known) clopidogrel (PLAVIX) 75 MG tablet  2. Which pharmacy/location (including street and city if local pharmacy) is medication to be sent to? [facility]  Phone: [phone]  3. Do they need a 30 day or 90 day supply? 90 day  Daughter called a script to be sent to [facility] because they will be doing mail order from now on.

## 2019-08-30 ENCOUNTER — Ambulatory Visit: Payer: Medicare HMO | Admitting: Cardiovascular Disease

## 2019-08-30 ENCOUNTER — Ambulatory Visit (HOSPITAL_COMMUNITY)
Admission: RE | Admit: 2019-08-30 | Discharge: 2019-08-30 | Disposition: A | Discharge status: Home / Self Care | Payer: Medicare HMO | Source: Ambulatory Visit | Attending: Cardiovascular Disease | Admitting: Cardiovascular Disease

## 2019-08-30 ENCOUNTER — Other Ambulatory Visit: Payer: Self-pay

## 2019-08-30 ENCOUNTER — Other Ambulatory Visit (HOSPITAL_COMMUNITY): Payer: Self-pay | Admitting: Cardiovascular Disease

## 2019-08-30 DIAGNOSIS — I739 Peripheral vascular disease, unspecified: Secondary | ICD-10-CM | POA: Diagnosis not present

## 2019-08-30 DIAGNOSIS — M79604 Pain in right leg: Secondary | ICD-10-CM

## 2019-08-30 DIAGNOSIS — I10 Essential (primary) hypertension: Secondary | ICD-10-CM | POA: Diagnosis not present

## 2019-08-30 NOTE — Progress Notes (Signed)
08/30/2019 [name]   [birth year]  [id number]  Primary Physician Jani Gravel, MD Primary Cardiologist: Lorretta Harp MD [name], [place]  HPI:  Danny Hudson is a 85 y.o.  thin appearing married [place] male father of 3, grandfather of 7 grandchildren is accompanied by his daughter [name] today.  He is referred by Dr. Maudie Mercury, his PCP, for evaluation of peripheral arterial disease.  His cardiologist is Dr. Percival Spanish who follows him for moderate mitral regurgitation.  I last saw him in the office 07/22/2019. He does have a history of hypertension and hyperlipidemia.  He has had right greater than left lower extremity claudication for the last 6 months which which is somewhat lifestyle limiting.  He had Doppler studies performed 06/29/2019 revealing a right ABI 0.67 in the left of 0.93.  There is no evidence of critical limb ischemia.  He denies chest pain or shortness of breath.   I performed peripheral angiography on him 08/15/2019 revealing an 80% mid right common iliac artery stenosis.  I put I placed a 7 mm x 29 mm long VBX covered stent successfully.  I performed Mynx closure of his right common femoral puncture site and send him home with the following day.  Dopplers performed a week later showed improvement in his ABI is normal with normal velocities.  His claudication has improved.  He does appear to have a bruit in his right groin consistent with a pseudoaneurysm.  Current Meds  Medication Sig  . amLODipine-valsartan (EXFORGE) 10-160 MG tablet Take 1 tablet by mouth daily.  Marland Kitchen aspirin EC 81 MG tablet Take 81 mg by mouth at bedtime.  . Cholecalciferol (VITAMIN D3) 50 MCG (2000 UT) capsule Take 2,000 Units by mouth daily.   . clopidogrel (PLAVIX) 75 MG tablet Take 1 tablet (75 mg total) by mouth daily with breakfast.  . simvastatin (ZOCOR) 40 MG tablet Take 20 mg by mouth at bedtime.      No Known Allergies  Social History   Socioeconomic History  . Marital status: Married    Spouse name: Not on file  . Number of children: 3  . Years of education: Not on file  . Highest education level: Not on file  Occupational History  . Not on file  Tobacco Use  . Smoking status: Never Smoker  . Smokeless tobacco: Never Used  Substance and Sexual Activity  . Alcohol use: Not on file  . Drug use: Not on file  . Sexual activity: Not on file  Other Topics Concern  . Not on file  Social History Narrative   Exercises routinely.   Lives with daughter.     Social Determinants of Health   Financial Resource Strain:   . Difficulty of Paying Living Expenses: Not on file  Food Insecurity:   . Worried About Charity fundraiser in the Last Year: Not on file  . Ran Out of Food in the Last Year: Not on file  Transportation Needs:   . Lack of Transportation (Medical): Not on file  . Lack of Transportation (Non-Medical): Not on file  Physical Activity:   . Days of Exercise per Week: Not on file  . Minutes of Exercise per Session: Not on file  Stress:   . Feeling of Stress : Not on file  Social Connections:   . Frequency of Communication with Friends and Family: Not on file  . Frequency of Social Gatherings with Friends and Family: Not on file  .  Attends Religious Services: Not on file  . Active Member of Clubs or Organizations: Not on file  . Attends Archivist Meetings: Not on file  . Marital Status: Not on file  Intimate Partner Violence:   . Fear of Current or Ex-Partner: Not on file  . Emotionally Abused: Not on file  . Physically Abused: Not on file  . Sexually Abused: Not on file     Review of Systems: General: negative for chills, fever, night sweats or weight changes.  Cardiovascular: negative for chest pain, dyspnea on exertion, edema, orthopnea, palpitations, paroxysmal nocturnal dyspnea or shortness of breath Dermatological: negative for rash Respiratory: negative for cough or wheezing Urologic: negative for hematuria Abdominal: negative for  nausea, vomiting, diarrhea, bright red blood per rectum, melena, or hematemesis Neurologic: negative for visual changes, syncope, or dizziness All other systems reviewed and are otherwise negative except as noted above.    Blood pressure (!) 132/58, pulse 78, temperature (!) 97.4 F (36.3 C), height 5\' 2"  (1.575 m), weight 137 lb (62.1 kg).  General appearance: alert and no distress Neck: no adenopathy, no carotid bruit, no JVD, supple, symmetrical, trachea midline and thyroid not enlarged, symmetric, no tenderness/mass/nodules Lungs: clear to auscultation bilaterally Heart: regular rate and rhythm, S1, S2 normal, no murmur, click, rub or gallop Extremities: extremities normal, atraumatic, no cyanosis or edema Pulses: 2+ and symmetric Skin: Skin color, texture, turgor normal. No rashes or lesions Neurologic: Alert and oriented X 3, normal strength and tone. Normal symmetric reflexes. Normal coordination and gait  EKG not performed today  ASSESSMENT AND PLAN:   Essential hypertension History of essential hypertension blood pressure measured today 132/58.  He is on amlodipine, and valsartan.  Peripheral arterial disease (HCC) History of peripheral arterial disease status post right iliac PTA and stenting by myself using a 7 mm x 29 mm long VBX covered stent.  He had Mynx closure of his puncture site.  I kept him overnight.  His follow-up Doppler studies performed 08/22/2019 revealed a widely patent stent with normal ABIs.  His symptoms of claudication as it have improved although he does have what sounds like a pseudoaneurysm on exam with a bruit in area of tenderness.  Cannot get a duplex ultrasound on him today.      Lorretta Harp MD FACP,FACC,FAHA, [facility] 08/30/2019 3:36 PM

## 2019-08-30 NOTE — Assessment & Plan Note (Signed)
History of essential hypertension blood pressure measured today 132/58.  He is on amlodipine, and valsartan.

## 2019-08-30 NOTE — Assessment & Plan Note (Signed)
History of peripheral arterial disease status post right iliac PTA and stenting by myself using a 7 mm x 29 mm long VBX covered stent.  He had Mynx closure of his puncture site.  I kept him overnight.  His follow-up Doppler studies performed 08/22/2019 revealed a widely patent stent with normal ABIs.  His symptoms of claudication as it have improved although he does have what sounds like a pseudoaneurysm on exam with a bruit in area of tenderness.  Cannot get a duplex ultrasound on him today.

## 2019-08-30 NOTE — Patient Instructions (Addendum)
Medication Instructions:  Your physician recommends that you continue on your current medications as directed. Please refer to the Current Medication list given to you today.  If you need a refill on your cardiac medications before your next appointment, please call your pharmacy.   Lab work: NONE  Testing/Procedures: in [place] has requested that you have a lower extremity arterial exercise duplex. During this test, exercise and ultrasound are used to evaluate arterial blood flow in the legs. Allow one hour for this exam. There are no restrictions or special instructions.  AND   Your physician has requested that you have an ankle brachial index (ABI). During this test an ultrasound and blood pressure cuff are used to evaluate the arteries that supply the arms and legs with blood. Allow thirty minutes for this exam. There are no restrictions or special instructions.   Follow-Up: At [facility], you and your health needs are our priority.  As part of our continuing mission to provide you with exceptional heart care, we have created designated Provider Care Teams.  These Care Teams include your primary Cardiologist (physician) and Advanced Practice Providers (APPs -  Physician Assistants and Nurse Practitioners) who all work together to provide you with the care you need, when you need it. You may see Quay Burow, MD or one of the following Advanced Practice Providers on your designated Care Team:    Kerin Ransom, PA-C  [place], [place]  Coletta Memos, [place] Your physician wants you to follow-up in: 1 year

## 2019-09-02 ENCOUNTER — Encounter: Payer: Medicare HMO | Admitting: Vascular Surgery

## 2019-09-05 ENCOUNTER — Other Ambulatory Visit: Payer: Self-pay | Admitting: Cardiovascular Disease

## 2019-09-05 DIAGNOSIS — R739 Hyperglycemia, unspecified: Secondary | ICD-10-CM | POA: Diagnosis not present

## 2019-09-05 DIAGNOSIS — I10 Essential (primary) hypertension: Secondary | ICD-10-CM | POA: Diagnosis not present

## 2019-09-05 DIAGNOSIS — E7800 Pure hypercholesterolemia, unspecified: Secondary | ICD-10-CM | POA: Diagnosis not present

## 2019-09-05 MED ORDER — CLOPIDOGREL BISULFATE 75 MG PO TABS: 75.0000 mg | ORAL_TABLET | Freq: Every day | ORAL | 3 refills | Status: DC

## 2019-09-05 NOTE — Telephone Encounter (Signed)
*  STAT* If patient is at the pharmacy, call can be transferred to refill team.   1. Which medications need to be refilled? (please list name of each medication and dose if known) clopidogrel (PLAVIX) 75 MG tablet  2. Which pharmacy/location (including street and city if local pharmacy) is medication to be sent to? [facility]  3. Do they need a 30 day or 90 day supply? 90 day

## 2019-09-05 NOTE — Telephone Encounter (Signed)
Requested Prescriptions   Signed Prescriptions Disp Refills  . clopidogrel (PLAVIX) 75 MG tablet 90 tablet 3    Sig: Take 1 tablet (75 mg total) by mouth daily with breakfast.    Authorizing Provider: Lorretta Harp    Ordering User: Britt Bottom

## 2019-09-05 NOTE — Addendum Note (Signed)
Addended by: Britt Bottom on: 09/05/2019 04:03 PM   Modules accepted: Orders

## 2019-09-12 DIAGNOSIS — E7800 Pure hypercholesterolemia, unspecified: Secondary | ICD-10-CM | POA: Diagnosis not present

## 2019-09-12 DIAGNOSIS — D649 Anemia, unspecified: Secondary | ICD-10-CM | POA: Diagnosis not present

## 2019-09-12 DIAGNOSIS — I739 Peripheral vascular disease, unspecified: Secondary | ICD-10-CM | POA: Diagnosis not present

## 2019-09-12 DIAGNOSIS — K2950 Unspecified chronic gastritis without bleeding: Secondary | ICD-10-CM | POA: Diagnosis not present

## 2019-09-12 DIAGNOSIS — E7439 Other disorders of intestinal carbohydrate absorption: Secondary | ICD-10-CM | POA: Diagnosis not present

## 2019-09-12 DIAGNOSIS — I10 Essential (primary) hypertension: Secondary | ICD-10-CM | POA: Diagnosis not present

## 2019-10-21 ENCOUNTER — Other Ambulatory Visit: Payer: Self-pay

## 2019-10-21 ENCOUNTER — Encounter (INDEPENDENT_AMBULATORY_CARE_PROVIDER_SITE_OTHER): Payer: Self-pay

## 2019-10-21 ENCOUNTER — Encounter: Payer: Self-pay | Admitting: Cardiovascular Disease

## 2019-10-21 ENCOUNTER — Ambulatory Visit: Payer: Medicare HMO | Admitting: Cardiovascular Disease

## 2019-10-21 DIAGNOSIS — I739 Peripheral vascular disease, unspecified: Secondary | ICD-10-CM | POA: Diagnosis not present

## 2019-10-21 NOTE — Assessment & Plan Note (Signed)
Danny Hudson returns a for follow-up of PAD.  He is an 86 year old [place] male who I performed right common iliac artery intervention on 08/15/2019 with a VBX covered stent.  His follow-up Dopplers performed 08/22/2019 revealed a widely patent right iliac.  Over the last week or 2 has had recurrent claudication similar to his preintervention symptoms.  He does have a palpable right pedal pulse but a soft bruit in his right groin.  We will recheck lower extremity arterial Doppler studies.

## 2019-10-21 NOTE — Patient Instructions (Signed)
Medication Instructions:  Your physician recommends that you continue on your current medications as directed. Please refer to the Current Medication list given to you today.  If you need a refill on your cardiac medications before your next appointment, please call your pharmacy.   Lab work: NONE  Testing/Procedures: Your physician has requested that you have a lower extremity arterial exercise duplex ON Monday 2/8. During this test, exercise and ultrasound are used to evaluate arterial blood flow in the legs. Allow one hour for this exam. There are no restrictions or special instructions.   Follow-Up: At [facility], you and your health needs are our priority.  As part of our continuing mission to provide you with exceptional heart care, we have created designated Provider Care Teams.  These Care Teams include your primary Cardiologist (physician) and Advanced Practice Providers (APPs -  Physician Assistants and Nurse Practitioners) who all work together to provide you with the care you need, when you need it. You may see Quay Burow, MD or one of the following Advanced Practice Providers on your designated Care Team:    Kerin Ransom, PA-C  [place], [place]  Coletta Memos, [place]  Your physician wants you to follow-up in: [place]

## 2019-10-21 NOTE — Progress Notes (Signed)
10/21/2019 Danny Hudson   [birth year]  [id number]  Primary Physician Danny Gravel, MD Primary Cardiologist: Danny Harp MD Danny Hudson, [place]  HPI:  Danny Hudson is a 86 y.o.  thin appearing married [place] male father of 70, grandfather of 7 grandchildren is accompanied by his daughter Danny Hudson today.  He is referred by Danny Hudson, his PCP, for evaluation of peripheral arterial disease.  His cardiologist is Dr. Percival Hudson who follows him for moderate mitral regurgitation.  He does have a history of hypertension and hyperlipidemia.  I last saw him in the office 07/27/2019. He has had right greater than left lower extremity claudication for the last 6 months which which is somewhat lifestyle limiting.  He had Doppler studies performed 06/29/2019 revealing a right ABI 0.67 in the left of 0.93.  There is no evidence of critical limb ischemia.  He denies chest pain or shortness of breath  His Doppler suggested a right iliac lesion.  Based on this I performed peripheral angiography on him 08/15/2019 revealing a high-grade right iliac lesion which I stented using a VBX covered stent.  He was discharged home the following day.  Lower extremity arterial Doppler studies performed 08/22/2019 revealed his right iliac to be widely patent.  He did enjoy improvement in his claudication.  He had a femoral duplex performed a week later to rule out pseudoaneurysm which was negative.  Apparently over the last week or so has had recurrent claudication symptoms symptoms similar to his preintervention symptoms.   Current Meds  Medication Sig  . amLODipine-valsartan (EXFORGE) 10-160 MG tablet Take 1 tablet by mouth daily.  Marland Kitchen aspirin EC 81 MG tablet Take 81 mg by mouth at bedtime.  . Cholecalciferol (VITAMIN D3) 50 MCG (2000 UT) capsule Take 2,000 Units by mouth daily.   . clopidogrel (PLAVIX) 75 MG tablet Take 1 tablet (75 mg total) by mouth daily with breakfast.  . simvastatin (ZOCOR) 40 MG tablet Take 20 mg by mouth  at bedtime.      No Known Allergies  Social History   Socioeconomic History  . Marital status: Married    Spouse name: Not on file  . Number of children: 3  . Years of education: Not on file  . Highest education level: Not on file  Occupational History  . Not on file  Tobacco Use  . Smoking status: Never Smoker  . Smokeless tobacco: Never Used  Substance and Sexual Activity  . Alcohol use: Not on file  . Drug use: Not on file  . Sexual activity: Not on file  Other Topics Concern  . Not on file  Social History Narrative   Exercises routinely.   Lives with daughter.     Social Determinants of Health   Financial Resource Strain:   . Difficulty of Paying Living Expenses: Not on file  Food Insecurity:   . Worried About Charity fundraiser in the Last Year: Not on file  . Ran Out of Food in the Last Year: Not on file  Transportation Needs:   . Lack of Transportation (Medical): Not on file  . Lack of Transportation (Non-Medical): Not on file  Physical Activity:   . Days of Exercise per Week: Not on file  . Minutes of Exercise per Session: Not on file  Stress:   . Feeling of Stress : Not on file  Social Connections:   . Frequency of Communication with Friends and Family: Not on file  . Frequency  of Social Gatherings with Friends and Family: Not on file  . Attends Religious Services: Not on file  . Active Member of Clubs or Organizations: Not on file  . Attends Archivist Meetings: Not on file  . Marital Status: Not on file  Intimate Partner Violence:   . Fear of Current or Ex-Partner: Not on file  . Emotionally Abused: Not on file  . Physically Abused: Not on file  . Sexually Abused: Not on file     Review of Systems: General: negative for chills, fever, night sweats or weight changes.  Cardiovascular: negative for chest pain, dyspnea on exertion, edema, orthopnea, palpitations, paroxysmal nocturnal dyspnea or shortness of breath Dermatological: negative  for rash Respiratory: negative for cough or wheezing Urologic: negative for hematuria Abdominal: negative for nausea, vomiting, diarrhea, bright red blood per rectum, melena, or hematemesis Neurologic: negative for visual changes, syncope, or dizziness All other systems reviewed and are otherwise negative except as noted above.    Blood pressure (!) 145/73, pulse (!) 59, temperature (!) 97.2 F (36.2 C), height 5\' 2"  (1.575 m), weight 138 lb (62.6 kg), SpO2 98 %.  General appearance: alert and no distress Neck: no adenopathy, no carotid bruit, no JVD, supple, symmetrical, trachea midline and thyroid not enlarged, symmetric, no tenderness/mass/nodules Lungs: clear to auscultation bilaterally Heart: regular rate and rhythm, S1, S2 normal, no murmur, click, rub or gallop Extremities: extremities normal, atraumatic, no cyanosis or edema Pulses: 2+ and symmetric Skin: Skin color, texture, turgor normal. No rashes or lesions Neurologic: Alert and oriented X 3, normal strength and tone. Normal symmetric reflexes. Normal coordination and gait  EKG not performed today  ASSESSMENT AND PLAN:   Claudication in peripheral vascular disease [facility]) Danny Hudson returns a for follow-up of PAD.  He is an 86 year old [place] male who I performed right common iliac artery intervention on 08/15/2019 with a VBX covered stent.  His follow-up Dopplers performed 08/22/2019 revealed a widely patent right iliac.  Over the last week or 2 has had recurrent claudication similar to his preintervention symptoms.  He does have a palpable right pedal pulse but a soft bruit in his right groin.  We will recheck lower extremity arterial Doppler studies.      Danny Harp MD FACP,FACC,FAHA, [facility] 10/21/2019 2:30 PM

## 2019-10-24 ENCOUNTER — Ambulatory Visit (HOSPITAL_COMMUNITY)
Admission: RE | Admit: 2019-10-24 | Discharge: 2019-10-24 | Disposition: A | Discharge status: Home / Self Care | Payer: Medicare HMO | Source: Ambulatory Visit | Attending: Cardiology | Admitting: Cardiology

## 2019-10-24 ENCOUNTER — Other Ambulatory Visit: Payer: Self-pay

## 2019-10-24 DIAGNOSIS — I739 Peripheral vascular disease, unspecified: Secondary | ICD-10-CM | POA: Insufficient documentation

## 2019-10-24 DIAGNOSIS — Z95828 Presence of other vascular implants and grafts: Secondary | ICD-10-CM | POA: Diagnosis present

## 2019-10-26 ENCOUNTER — Telehealth: Payer: Self-pay | Admitting: *Deleted

## 2019-10-26 NOTE — Telephone Encounter (Signed)
Left message for patient to call and schedule follow up appointment with Dr. Gwenlyn Found for doppler study results

## 2019-11-02 ENCOUNTER — Other Ambulatory Visit: Payer: Self-pay

## 2019-11-02 ENCOUNTER — Encounter: Payer: Self-pay | Admitting: Cardiovascular Disease

## 2019-11-02 ENCOUNTER — Ambulatory Visit (INDEPENDENT_AMBULATORY_CARE_PROVIDER_SITE_OTHER): Payer: Medicare HMO | Admitting: Cardiovascular Disease

## 2019-11-02 VITALS — BP 149/67 | HR 82 | Ht 62.0 in | Wt 139.8 lb

## 2019-11-02 DIAGNOSIS — I739 Peripheral vascular disease, unspecified: Secondary | ICD-10-CM | POA: Diagnosis not present

## 2019-11-02 MED ORDER — SODIUM CHLORIDE 0.9% FLUSH: 3.0000 mL | Freq: Two times a day (BID) | INTRAVENOUS | Status: DC

## 2019-11-02 NOTE — H&P (View-Only) (Signed)
Danny Hudson returns today with his daughter Danny Hudson to discuss his recent Doppler studies.  He had right iliac stenting using a VBX 7 x 29 mm stent 08/15/2019 with excellent angiographic and clinical result.  His claudication resolved after that.  His Dopplers performed on 08/22/2019 showed normal ABIs with fairly normal velocities.  When I saw him back in the office on 10/21/2019 he had symptoms similar to his preintervention symptoms and Doppler studies performed 10/24/2019 revealed increase in velocities in his right common iliac artery suggesting a potential technical issue possibly a edge dissection.  This is too soon for restenosis.  Based on this we decided to go back and reangiogram him and address any flow-limiting issues.  Lorretta Harp, M.D., [place], [facility]), [place] [address] [place], [place]  [address]  [phone] 11/02/2019 3:27 PM

## 2019-11-02 NOTE — Progress Notes (Signed)
Mr. Decaprio returns today with his daughter [name] to discuss his recent Doppler studies.  He had right iliac stenting using a VBX 7 x 29 mm stent 08/15/2019 with excellent angiographic and clinical result.  His claudication resolved after that.  His Dopplers performed on 08/22/2019 showed normal ABIs with fairly normal velocities.  When I saw him back in the office on 10/21/2019 he had symptoms similar to his preintervention symptoms and Doppler studies performed 10/24/2019 revealed increase in velocities in his right common iliac artery suggesting a potential technical issue possibly a edge dissection.  This is too soon for restenosis.  Based on this we decided to go back and reangiogram him and address any flow-limiting issues.  Lorretta Harp, M.D., [place], [facility], [place] [address] [place], [place]  [address]  [phone] 11/02/2019 3:27 PM

## 2019-11-02 NOTE — Patient Instructions (Addendum)
Medication Instructions:  Your physician recommends that you continue on your current medications as directed. Please refer to the Current Medication list given to you today.  If you need a refill on your cardiac medications before your next appointment, please call your pharmacy.   Lab work: CBC, BMET If you have labs (blood work) drawn today and your tests are completely normal, you will receive your results only by: [place] (if you have MyChart) OR A paper copy in the mail If you have any lab test that is abnormal or we need to change your treatment, we will call you to review the results.  Testing/Procedures: Your physician has requested that you have a peripheral vascular angiogram on 11/14/2019. This exam is performed at the hospital. During this exam IV contrast is used to look at arterial blood flow. Please review the information sheet given for details.  AND ONE WEEK AFTER PROCEDURE ON 11/16/19  Your physician has requested that you have a lower extremity arterial exercise duplex with ABIs. During this test, exercise and ultrasound are used to evaluate arterial blood flow in the legs. Allow one hour for this exam. There are no restrictions or special instructions.   AND  Iliac/Aorta Dopplers  Follow-Up: At [facility], you and your health needs are our priority.  As part of our continuing mission to provide you with exceptional heart care, we have created designated Provider Care Teams.  These Care Teams include your primary Cardiologist (physician) and Advanced Practice Providers (APPs -  Physician Assistants and Nurse Practitioners) who all work together to provide you with the care you need, when you need it. You may see Quay Burow, MD or one of the following Advanced Practice Providers on your designated Care Team:    Kerin Ransom, PA-C  [place], [place]  Coletta Memos, [place]  Your physician wants you to follow-up in: 2 weeks after procedure on  11/14/2019  Any Other Special Instructions Will Be Listed Below (If Applicable).   You are scheduled for a Peripheral Angiogram on Monday, March 1 with Dr. Quay Burow.  1. Please arrive at the [facility] (Main Entrance A) at [facility]: [address] [place], [place] [address] at 7:30 AM (This time is two hours before your procedure to ensure your preparation). Free valet parking service is available.   Special note: Every effort is made to have your procedure done on time. Please understand that emergencies sometimes delay scheduled procedures.  2. Diet: Do not eat solid foods after midnight.  The patient may have clear liquids until 5am upon the day of the procedure.  3. Labs: You will need to have blood drawn today.  4. Medication instructions in preparation for your procedure:   Contrast Allergy: No   On the morning of your procedure, take your Plavix/Clopidogrel and any morning medicines NOT listed above.  You may use sips of water.  5. Plan for one night stay--bring personal belongings. 6. Bring a current list of your medications and current insurance cards. 7. You MUST have a responsible person to drive you home. 8. Someone MUST be with you the first 24 hours after you arrive home or your discharge will be delayed. 9. Please wear clothes that are easy to get on and off and wear slip-on shoes.  You have to be tested for Covid and quarantine from your covid test until your procedure on Monday, March 1st. This will be scheduled on Friday, 11/11/2019 @ 9:40am. This is a Drive  Up Visit at the [facility] [address] [place]. Someone will direct you to the appropriate testing line. Stay in your car and someone will be with you shortly.  Thank you for allowing [place] to care for you!   -- [place] [facility]

## 2019-11-03 LAB — CBC
Hematocrit: 39.8 % (ref 37.5–51.0)
Hemoglobin: 13.3 g/dL (ref 13.0–17.7)
MCH: 30 pg (ref 26.6–33.0)
MCHC: 33.4 g/dL (ref 31.5–35.7)
MCV: 90 fL (ref 79–97)
Platelets: 203 10*3/uL (ref 150–450)
RBC: 4.43 x10E6/uL (ref 4.14–5.80)
RDW: 12.4 % (ref 11.6–15.4)
WBC: 10.1 10*3/uL (ref 3.4–10.8)

## 2019-11-03 LAB — BASIC METABOLIC PANEL
BUN/Creatinine Ratio: 21 (ref 10–24)
BUN: 25 mg/dL (ref 8–27)
CO2: 20 mmol/L (ref 20–29)
Calcium: 8.9 mg/dL (ref 8.6–10.2)
Chloride: 105 mmol/L (ref 96–106)
Creatinine, Ser: 1.17 mg/dL (ref 0.76–1.27)
GFR calc Af Amer: 64 mL/min/{1.73_m2} (ref >59)
GFR calc non Af Amer: 55 mL/min/{1.73_m2} — ABNORMAL LOW (ref >59)
Glucose: 94 mg/dL (ref 65–99)
Potassium: 4.5 mmol/L (ref 3.5–5.2)
Sodium: 141 mmol/L (ref 134–144)

## 2019-11-09 ENCOUNTER — Telehealth: Payer: Self-pay | Admitting: Cardiovascular Disease

## 2019-11-09 NOTE — Telephone Encounter (Signed)
Called and let pt daughter know that they can not request certain translators and that one has been requested for pt procedure on 11/14/2019. Verbalized understanding.

## 2019-11-09 NOTE — Telephone Encounter (Signed)
New Message    Pts daughter is calling and says she wants the same translator that has been with him before to his appointments. She said before when he had a procedure the translator did not get there and they had to call her.  Sunmi Lutricia Feil is the translators name, and the pts daughter wants to make sure the translator is there early    Please advise

## 2019-11-10 ENCOUNTER — Telehealth: Payer: Self-pay | Admitting: *Deleted

## 2019-11-10 NOTE — Telephone Encounter (Signed)
Pt contacted pre-abdominal aortogram  scheduled at [facility] for: Monday November 14, 2019 9:30 AM Verified arrival time and place: [place] [facility]) at:7:30 AM   No solid food after midnight prior to cath, clear liquids until 5 AM day of procedure. Contrast allergy: no   AM meds can be  taken pre-cath with sip of water including: ASA 81 mg Plavix 75 mg  Confirmed patient has responsible adult to drive home post procedure and observe 24 hours after arriving home: yes  Currently, due to Covid-19 pandemic, only one person will be allowed with patient. Must be the same person for patient's entire stay and will be required to wear a mask. They will be asked to wait in the waiting room for the duration of the patient's stay.  Patients are required to wear a mask when they enter the hospital.      COVID-19 Pre-Screening Questions:  . In the past 7 to 10 days have you had a cough,  shortness of breath, headache, congestion, fever (100 or greater) body aches, chills, sore throat, or sudden loss of taste or sense of smell? no . Have you been around anyone with known Covid 19 in the past 7-10 days? no . Have you been around anyone who is awaiting Covid 19 test results in the past 7 to 10 days? no . Have you been around anyone who has been exposed to Covid 19, or has mentioned symptoms of Covid 19 within the past 7 to 10 days? no   I reviewed procedure/mask/visitor instructions, COVID-19 screening questions with patient's daughter (DPR), [name], she verbalized understanding, thanked me for call.

## 2019-11-11 ENCOUNTER — Other Ambulatory Visit (HOSPITAL_COMMUNITY)
Admission: RE | Admit: 2019-11-11 | Discharge: 2019-11-11 | Disposition: A | Discharge status: Home / Self Care | Payer: Medicare HMO | Source: Ambulatory Visit | Attending: Cardiovascular Disease | Admitting: Cardiovascular Disease

## 2019-11-11 DIAGNOSIS — Z20822 Contact with and (suspected) exposure to covid-19: Secondary | ICD-10-CM | POA: Insufficient documentation

## 2019-11-11 DIAGNOSIS — Z01812 Encounter for preprocedural laboratory examination: Secondary | ICD-10-CM | POA: Insufficient documentation

## 2019-11-11 LAB — SARS CORONAVIRUS 2 (TAT 6-24 HRS): SARS Coronavirus 2: NEGATIVE

## 2019-11-14 ENCOUNTER — Other Ambulatory Visit: Payer: Self-pay

## 2019-11-14 ENCOUNTER — Ambulatory Visit (HOSPITAL_COMMUNITY)
Admission: RE | Admit: 2019-11-14 | Discharge: 2019-11-15 | Disposition: A | Discharge status: Home / Self Care | Payer: Medicare HMO | Attending: Cardiovascular Disease | Admitting: Cardiovascular Disease

## 2019-11-14 ENCOUNTER — Encounter (HOSPITAL_COMMUNITY)
Admission: RE | Disposition: A | Discharge status: Home / Self Care | Payer: Self-pay | Source: Home / Self Care | Attending: Cardiovascular Disease

## 2019-11-14 DIAGNOSIS — Z79899 Other long term (current) drug therapy: Secondary | ICD-10-CM | POA: Insufficient documentation

## 2019-11-14 DIAGNOSIS — I10 Essential (primary) hypertension: Secondary | ICD-10-CM | POA: Insufficient documentation

## 2019-11-14 DIAGNOSIS — I340 Nonrheumatic mitral (valve) insufficiency: Secondary | ICD-10-CM | POA: Insufficient documentation

## 2019-11-14 DIAGNOSIS — Z7902 Long term (current) use of antithrombotics/antiplatelets: Secondary | ICD-10-CM | POA: Insufficient documentation

## 2019-11-14 DIAGNOSIS — I739 Peripheral vascular disease, unspecified: Secondary | ICD-10-CM | POA: Diagnosis present

## 2019-11-14 DIAGNOSIS — E785 Hyperlipidemia, unspecified: Secondary | ICD-10-CM | POA: Insufficient documentation

## 2019-11-14 DIAGNOSIS — I70211 Atherosclerosis of native arteries of extremities with intermittent claudication, right leg: Secondary | ICD-10-CM

## 2019-11-14 DIAGNOSIS — Z7982 Long term (current) use of aspirin: Secondary | ICD-10-CM | POA: Insufficient documentation

## 2019-11-14 HISTORY — PX: ABDOMINAL AORTOGRAM W/LOWER EXTREMITY: CATH118223

## 2019-11-14 HISTORY — PX: PERIPHERAL VASCULAR INTERVENTION: CATH118257

## 2019-11-14 LAB — POCT ACTIVATED CLOTTING TIME: Activated Clotting Time: 279 seconds

## 2019-11-14 SURGERY — ABDOMINAL AORTOGRAM W/LOWER EXTREMITY
Anesthesia: LOCAL | Laterality: Right

## 2019-11-14 MED ORDER — SODIUM CHLORIDE 0.9% FLUSH: 3.0000 mL | INTRAVENOUS | Status: DC | PRN

## 2019-11-14 MED ORDER — LIDOCAINE HCL (PF) 1 % IJ SOLN
INTRAMUSCULAR | Status: AC
  Filled 2019-11-14: qty 30

## 2019-11-14 MED ORDER — LIDOCAINE HCL (PF) 1 % IJ SOLN
INTRAMUSCULAR | Status: DC | PRN
  Administered 2019-11-14: 20 mL via INTRADERMAL

## 2019-11-14 MED ORDER — ATORVASTATIN CALCIUM 80 MG PO TABS
80.0000 mg | ORAL_TABLET | Freq: Every day | ORAL | Status: DC
  Administered 2019-11-14: 80 mg via ORAL
  Filled 2019-11-14: qty 1

## 2019-11-14 MED ORDER — CLOPIDOGREL BISULFATE 75 MG PO TABS: 75.0000 mg | ORAL_TABLET | Freq: Every day | ORAL | Status: DC

## 2019-11-14 MED ORDER — CLOPIDOGREL BISULFATE 75 MG PO TABS
75.0000 mg | ORAL_TABLET | Freq: Every day | ORAL | Status: DC
  Administered 2019-11-15: 75 mg via ORAL
  Filled 2019-11-14: qty 1

## 2019-11-14 MED ORDER — SODIUM CHLORIDE 0.9 % WEIGHT BASED INFUSION
3.0000 mL/kg/h | INTRAVENOUS | Status: DC
  Administered 2019-11-14: 3 mL/kg/h via INTRAVENOUS

## 2019-11-14 MED ORDER — SODIUM CHLORIDE 0.9 % IV SOLN: INTRAVENOUS | Status: AC

## 2019-11-14 MED ORDER — ASPIRIN 81 MG PO CHEW: 81.0000 mg | CHEWABLE_TABLET | ORAL | Status: DC

## 2019-11-14 MED ORDER — SODIUM CHLORIDE 0.9 % IV SOLN: 250.0000 mL | INTRAVENOUS | Status: DC | PRN

## 2019-11-14 MED ORDER — NITROGLYCERIN 1 MG/10 ML FOR IR/CATH LAB
INTRA_ARTERIAL | Status: AC
  Filled 2019-11-14: qty 10

## 2019-11-14 MED ORDER — HEPARIN (PORCINE) IN NACL 1000-0.9 UT/500ML-% IV SOLN
INTRAVENOUS | Status: DC | PRN
  Administered 2019-11-14 (×2): 500 mL

## 2019-11-14 MED ORDER — LABETALOL HCL 5 MG/ML IV SOLN: 10.0000 mg | INTRAVENOUS | Status: AC | PRN

## 2019-11-14 MED ORDER — AMLODIPINE BESYLATE 10 MG PO TABS
10.0000 mg | ORAL_TABLET | Freq: Every day | ORAL | Status: DC
  Administered 2019-11-15: 10 mg via ORAL
  Filled 2019-11-14: qty 1

## 2019-11-14 MED ORDER — ACETAMINOPHEN 325 MG PO TABS: 650.0000 mg | ORAL_TABLET | ORAL | Status: DC | PRN

## 2019-11-14 MED ORDER — SIMVASTATIN 20 MG PO TABS: 20.0000 mg | ORAL_TABLET | Freq: Every day | ORAL | Status: DC

## 2019-11-14 MED ORDER — ONDANSETRON HCL 4 MG/2ML IJ SOLN: 4.0000 mg | Freq: Four times a day (QID) | INTRAMUSCULAR | Status: DC | PRN

## 2019-11-14 MED ORDER — IRBESARTAN 150 MG PO TABS
150.0000 mg | ORAL_TABLET | Freq: Every day | ORAL | Status: DC
  Administered 2019-11-15: 150 mg via ORAL
  Filled 2019-11-14: qty 1

## 2019-11-14 MED ORDER — HYDRALAZINE HCL 20 MG/ML IJ SOLN: 10.0000 mg | INTRAMUSCULAR | Status: AC | PRN

## 2019-11-14 MED ORDER — IODIXANOL 320 MG/ML IV SOLN
INTRAVENOUS | Status: DC | PRN
  Administered 2019-11-14: 10:00:00 65 mL via INTRA_ARTERIAL

## 2019-11-14 MED ORDER — SODIUM CHLORIDE 0.9% FLUSH
3.0000 mL | Freq: Two times a day (BID) | INTRAVENOUS | Status: DC
  Administered 2019-11-14: 3 mL via INTRAVENOUS

## 2019-11-14 MED ORDER — NITROGLYCERIN 1 MG/10 ML FOR IR/CATH LAB
INTRA_ARTERIAL | Status: DC | PRN
  Administered 2019-11-14 (×2): 200 ug via INTRA_ARTERIAL

## 2019-11-14 MED ORDER — ASPIRIN 81 MG PO CHEW: 81.0000 mg | CHEWABLE_TABLET | Freq: Every day | ORAL | Status: DC

## 2019-11-14 MED ORDER — HEPARIN SODIUM (PORCINE) 1000 UNIT/ML IJ SOLN
INTRAMUSCULAR | Status: AC
  Filled 2019-11-14: qty 1

## 2019-11-14 MED ORDER — AMLODIPINE BESYLATE-VALSARTAN 10-160 MG PO TABS: 1.0000 | ORAL_TABLET | Freq: Every day | ORAL | Status: DC

## 2019-11-14 MED ORDER — MORPHINE SULFATE (PF) 2 MG/ML IV SOLN: 2.0000 mg | INTRAVENOUS | Status: DC | PRN

## 2019-11-14 MED ORDER — SODIUM CHLORIDE 0.9 % WEIGHT BASED INFUSION: 1.0000 mL/kg/h | INTRAVENOUS | Status: DC

## 2019-11-14 MED ORDER — HEPARIN SODIUM (PORCINE) 1000 UNIT/ML IJ SOLN
INTRAMUSCULAR | Status: DC | PRN
  Administered 2019-11-14: 6000 [IU] via INTRAVENOUS

## 2019-11-14 MED ORDER — HEPARIN (PORCINE) IN NACL 1000-0.9 UT/500ML-% IV SOLN
INTRAVENOUS | Status: AC
  Filled 2019-11-14: qty 1000

## 2019-11-14 MED ORDER — ASPIRIN EC 81 MG PO TBEC: 81.0000 mg | DELAYED_RELEASE_TABLET | Freq: Every day | ORAL | Status: DC

## 2019-11-14 SURGICAL SUPPLY — 22 items
BALLN MUSTANG 7.0X20 75 (BALLOONS) ×3
BALLOON MUSTANG 7.0X20 75 (BALLOONS) IMPLANT
CATH ANGIO 5F PIGTAIL 65CM (CATHETERS) ×1 IMPLANT
CATH STRAIGHT 5FR 65CM (CATHETERS) ×1 IMPLANT
CLOSURE MYNX CONTROL 6F/7F (Vascular Products) ×1 IMPLANT
KIT ENCORE 26 ADVANTAGE (KITS) ×1 IMPLANT
KIT PV (KITS) ×3 IMPLANT
SHEATH BRITE TIP 6FR 35CM (SHEATH) ×1 IMPLANT
SHEATH PINNACLE 5F 10CM (SHEATH) ×1 IMPLANT
SHEATH PINNACLE 6F 10CM (SHEATH) ×1 IMPLANT
SHEATH PROBE COVER 6X72 (BAG) ×1 IMPLANT
STENT ABSOLUTE PRO 8X30X135 (Permanent Stent) ×1 IMPLANT
STENT ABSOLUTE PRO 8X40X135 (Permanent Stent) ×1 IMPLANT
STOPCOCK MORSE 400PSI 3WAY (MISCELLANEOUS) ×1 IMPLANT
SYR MEDRAD MARK 7 150ML (SYRINGE) ×3 IMPLANT
TAPE VIPERTRACK RADIOPAQ (MISCELLANEOUS) IMPLANT
TAPE VIPERTRACK RADIOPAQUE (MISCELLANEOUS) ×3
TRANSDUCER W/STOPCOCK (MISCELLANEOUS) ×3 IMPLANT
TRAY PV CATH (CUSTOM PROCEDURE TRAY) ×3 IMPLANT
TUBING CIL FLEX 10 FLL-RA (TUBING) ×1 IMPLANT
WIRE HI TORQ VERSACORE J 260CM (WIRE) ×1 IMPLANT
WIRE HITORQ VERSACORE ST 145CM (WIRE) ×1 IMPLANT

## 2019-11-14 NOTE — Interval H&P Note (Signed)
History and Physical Interval Note:  11/14/2019 8:56 AM  Danny Hudson  has presented today for surgery, with the diagnosis of right illac disease.  The various methods of treatment have been discussed with the patient and family. After consideration of risks, benefits and other options for treatment, the patient has consented to  Procedure(s): ABDOMINAL AORTOGRAM W/LOWER EXTREMITY (N/A) as a surgical intervention.  The patient's history has been reviewed, patient examined, no change in status, stable for surgery.  I have reviewed the patient's chart and labs.  Questions were answered to the patient's satisfaction.     Quay Burow

## 2019-11-14 NOTE — Progress Notes (Signed)
MOBILITY TEAM - Progress Note   11/14/19 1500  Mobility  Activity Ambulated to bathroom;Ambulated in hall  Level of Assistance Standby assist, set-up cues, supervision of patient - no hands on  Assistive Device None  Distance Ambulated (ft) 510 ft  Mobility Response Tolerated well  Bed Position  (seated in recliner)   Pre-activity: HR 109, BP 140/70, SpO2 97% RA Post-activity: HR 89, BP 158/69, SpO2 93% RA   Interpreter present and utilized during session. Pt moving well; asymptomatic with mobility, denies pain.   Mabeline Caras, PT, DPT Mobility Team Pager [phone]

## 2019-11-14 NOTE — Progress Notes (Signed)
Interpretor w/patient; reviewed bedrest instructions.

## 2019-11-14 NOTE — Progress Notes (Signed)
Pt transferred to 4E-12 via stretcher from the cath lab. Pt was slid to bed. CHG bath given, Tele applied CCMD notified. VSS. Pt on bed rest x 1 more hour, pt voiced understanding via interpreter. Will continue to monitor.  Amanda Cockayne, RN

## 2019-11-14 NOTE — Discharge Summary (Signed)
Discharge Summary    Patient ID: Danny Hudson,  MRN: [id number], DOB/AGE: Oct 09, 1927 86 y.o.  Admit date: 11/14/2019 Discharge date: 11/15/2019  Primary Care Provider: Jani Gravel Primary Cardiologist: Quay Burow, MD  Discharge Diagnoses    Active Problems:   Claudication in peripheral vascular disease [facility])   Allergies Not on File  Diagnostic Studies/Procedures    PV angiogram: 11/14/19  Procedures Performed:               1.  Ultrasound-guided right common femoral access               2.  Abdominal aortogram/bilateral iliac angiogram               3.  Pullback gradient across the proximal mid and distal right common iliac artery after the administration of 200 mcg of intra-arterial                 nitroglycerin               4.  PTA and stenting using a nitinol self-expanding stents of the proximal and distal right common iliac artery (Abbott absolute Pro)  Final Impression: Successful proximal and distal right common iliac artery restenting using Abbott nitinol absolute Pro self-expanding stents for recurrent claudication with hemodynamically/physiologically significant lesions demonstrated by pullback gradient. The patient will be gently hydrated overnight.  He will continue aspirin and Plavix.  If he remains stable he will be discharged home the morning.  We will get lower extremity arterial Doppler studies in our [facility] office next week and I will see him back 1 or 2 weeks thereafter.   Quay Burow. MD, [facility] _____________   History of Present Illness     86 y.o.  male who was referred to Dr. Gwenlyn Found by Dr. Maudie Mercury, his PCP, for evaluation of peripheral arterial disease. His cardiologist is Dr. Percival Spanish who follows him for moderate mitral regurgitation. He does have a history of hypertension and hyperlipidemia.He has had right greater than left lower extremity claudication for the last 6 months which which is somewhat lifestyle limiting. He had Doppler studies  performed 06/29/2019 revealing a right ABI 0.67 in the left of 0.93. There is no evidence of critical limb ischemia.  His doppler suggested a right iliac lesion.  Based on that Dr. Gwenlyn Found performed peripheral angiography on him 08/15/2019 revealing a high-grade right iliac lesion which was stented using a VBX covered stent.  He was discharged home the following day.  Lower extremity arterial Doppler studies performed 08/22/2019 revealed his right iliac to be widely patent. He had a femoral duplex performed a week later to rule out pseudoaneurysm which was negative. He was seen back in the office with recurrence of symptoms and set up for outpatient PV angiogram.    Hospital Course     Underwent successful proximal and distal right common iliac artery restenting using Abbott nitinol absolute Pro self-expanding stents, Plan for DAPT with ASA/plavix. He was hydrated overnight and no complications noted. Able to ambulate the following morning. He was switched from Zocor to Atorvastatin during this admission.   General: Well developed, well nourished, male appearing in no acute distress. Head: Normocephalic, atraumatic.  Neck: Supple without bruits, JVD. Lungs:  Resp regular and unlabored, CTA. Heart: RRR, S1, S2, soft systolic murmur; no rub. Abdomen: Soft, non-tender, non-distended with normoactive bowel sounds. No hepatomegaly. No rebound/guarding. No obvious abdominal masses. Extremities: No clubbing, cyanosis, edema. Distal pedal pulses are 2+ bilaterally.  Right femoral cath site stable without bruising or hematoma Neuro: Alert and oriented X 3. Moves all extremities spontaneously. Psych: Normal affect.  [name] was seen by Dr. Angelena Form and determined stable for discharge home. Follow up in the office has been arranged. Medications are listed below.   _____________  Discharge Vitals Blood pressure (!) 141/87, pulse 81, temperature 97.7 F (36.5 C), temperature source Oral, resp. rate  19, height 5\' 2"  (1.575 m), weight 62.6 kg, SpO2 99 %.  Filed Weights   11/14/19 0740  Weight: 62.6 kg    Labs & Radiologic Studies    CBC Recent Labs    11/15/19 0306  WBC 10.6*  HGB 12.0*  HCT 36.6*  MCV 90.8  PLT 123XX123   Basic Metabolic Panel Recent Labs    11/15/19 0306  NA 138  K 4.2  CL 109  CO2 22  GLUCOSE 111*  BUN 21  CREATININE 1.17  CALCIUM 8.3*   Liver Function Tests No results for input(s): AST, ALT, ALKPHOS, BILITOT, PROT, ALBUMIN in the last 72 hours. No results for input(s): LIPASE, AMYLASE in the last 72 hours. Cardiac Enzymes No results for input(s): CKTOTAL, CKMB, CKMBINDEX, TROPONINI in the last 72 hours. BNP Invalid input(s): POCBNP D-Dimer No results for input(s): DDIMER in the last 72 hours. Hemoglobin A1C No results for input(s): HGBA1C in the last 72 hours. Fasting Lipid Panel No results for input(s): CHOL, HDL, LDLCALC, TRIG, CHOLHDL, LDLDIRECT in the last 72 hours. Thyroid Function Tests No results for input(s): TSH, T4TOTAL, T3FREE, THYROIDAB in the last 72 hours.  Invalid input(s): FREET3 _____________  PERIPHERAL VASCULAR CATHETERIZATION  Result Date: 11/14/2019  [id number] LOCATION:  FACILITY: [facility] PHYSICIAN: Quay Burow, M.D. [birth year] DATE OF PROCEDURE:  11/14/2019 DATE OF DISCHARGE: PV Angiogram/Intervention History obtained from chart review.Danny Hudson is a 86 y.o.  thin appearing married [place] male father of 29, grandfather of 7 grandchildren is accompanied by his daughter [name] today. He is referred by Dr. Maudie Mercury, his PCP, for evaluation of peripheral arterial disease. His cardiologist is Dr. Percival Spanish who follows him for moderate mitral regurgitation. He does have a history of hypertension and hyperlipidemia.  I last saw him in the office 07/27/2019.He has had right greater than left lower extremity claudication for the last 6 months which which is somewhat lifestyle limiting. He had Doppler studies performed 06/29/2019  revealing a right ABI 0.67 in the left of 0.93. There is no evidence of critical limb ischemia. He denies chest pain or shortness of breath  His Doppler suggested a right iliac lesion.  Based on this I performed peripheral angiography on him 08/15/2019 revealing a high-grade right iliac lesion which I stented using a VBX covered stent.  He was discharged home the following day.  Lower extremity arterial Doppler studies performed 08/22/2019 revealed his right iliac to be widely patent.  He did enjoy improvement in his claudication.  He had a femoral duplex performed a week later to rule out pseudoaneurysm which was negative.  Apparently recently has had recurrent claudication symptoms symptoms similar to his preintervention symptoms.  Dopplers performed to evaluate this showed high-frequency signal in the right common iliac artery.  Because of this we have agreed to proceed with reangiography and potential intervention for lifestyle limiting claudication.  Pre Procedure Diagnosis: Peripheral arterial disease Post Procedure Diagnosis: Peripheral arterial disease Operators: Dr. Quay Burow Procedures Performed:  1.  Ultrasound-guided right common femoral access  2.  Abdominal aortogram/bilateral iliac angiogram  3.  Pullback  gradient across the proximal mid and distal right common iliac artery after the administration of 200 mcg of intra-arterial   nitroglycerin  4.  PTA and stenting using a nitinol self-expanding stents of the proximal and distal right common iliac artery (Abbott absolute Pro) PROCEDURE DESCRIPTION: The patient was brought to the second floor [facility] cath lab in the the postabsorptive state. He was not premedicated . His right groin was prepped and shaved in usual sterile fashion. Xylocaine 1% was used for local anesthesia. A 5 French sheath was inserted into the right common femoral artery using standard Seldinger technique.  Ultrasound was used to identify the vessel and guide  access which was done under direct ultrasound guidance.  A digital image of the ultrasound was captured and placed in the patient's chart.  A 5 French pigtail catheter was placed in the mid abdominal aorta.  Abdominal aortography and bilateral iliac angiography were performed.  A 5 French endhole catheter was used along with 200 mcg of intra-arterial nitroglycerin x2 to assess physiologic significance of a proximal and more distal lesion in the right common iliac artery on either side of the previously placed VBX stent both of which were significant.  Angiographic Data: 1: Abdominal aorta-widely patent 2: Left lower extremity-30% proximal left common iliac artery stenosis 3: Right lower extremity-50% proximal right common iliac artery stenosis at the leading edge of the previously placed stent and 5060% distal just before the takeoff of the hypogastric artery.  Both of these lesions were physiologically significant using an endhole catheter and performing pullback gradients with intra-arterial nitroglycerin.  Mr. Vivian has a patent mid right common iliac artery VBX stent with hemodynamically significant lesions on either side.  We will proceed with PTA and restenting using Abbott absolute Pro nitinol self-expanding stents.   Patent VBX stent with physiologically significant lesions on either side demonstrated physiologically using pullback gradient.  Proceed with restenting Procedure Description: The 5 French sheath was exchanged for a 6 French bright tip sheath.  Patient received 6 Celsius of heparin with an ACT of 279.  Total of 65 cc of contrast was administered to the patient during the case.  The distal lesion was stented with a 8 mm x 4 cm absolute Pro postdilated with a 7 mm x 2 cm balloon.  The proximal lesion with an 8 mm x 3 cm absolute Pro dilated with the same balloon resulting reduction of 50 to 60% proximal and distal right common iliac artery stenoses to 0% residual.  Patient tolerated procedure  well.  The bright tip sheath was exchanged over the 035 versa core wire for a short 6 [place] sheath.  Right common femoral angiogram was performed and a MYNX closure device was successfully deployed achieving hemostasis. Final Impression: Successful proximal and distal right common iliac artery restenting using Abbott nitinol absolute Pro self-expanding stents for recurrent claudication with hemodynamically/physiologically significant lesions demonstrated by pullback gradient.  The patient will be gently hydrated overnight.  He will continue aspirin and Plavix.  If he remains stable he will be discharged home the morning.  We will get lower extremity arterial Doppler studies in our [facility] office next week and I will see him back 1 or 2 weeks thereafter. Quay Burow. MD, [facility] 11/14/2019 10:26 AM   VAS [place] ABI WITH/WO TBI  Result Date: 10/24/2019 LOWER EXTREMITY DOPPLER STUDY Indications: Claudication, peripheral artery disease, and Initially felt              improvement in symptoms.  In the last week he has noticed              claudication similar to before the stent, not as significant,              though. High Risk Factors: Hypertension, no history of smoking. Other Factors: SEE AORTO-ILIAC DUPLEX.  Vascular Interventions: Right common iliac artery stent placed 08/15/2019. Comparison Study: 08/22/2019 bilateral ABI's were >1.0. Performing Technologist: [employer] BS, RVT, RDCS  Examination Guidelines: A complete evaluation includes at minimum, Doppler waveform signals and systolic blood pressure reading at the level of bilateral brachial, anterior tibial, and posterior tibial arteries, when vessel segments are accessible. Bilateral testing is considered an integral part of a complete examination. Photoelectric Plethysmograph (PPG) waveforms and toe systolic pressure readings are included as required and additional duplex testing as needed. Limited examinations for reoccurring indications may be performed  as noted.  ABI Findings: +---------+------------------+-----+---------+--------+ Right    Rt Pressure (mmHg)IndexWaveform Comment  +---------+------------------+-----+---------+--------+ Brachial 139                                      +---------+------------------+-----+---------+--------+ ATA      137               0.99 triphasic         +---------+------------------+-----+---------+--------+ PTA      143               1.03 triphasic         +---------+------------------+-----+---------+--------+ PERO     136               0.98 triphasic         +---------+------------------+-----+---------+--------+ Great Toe114               0.82 Normal            +---------+------------------+-----+---------+--------+ +---------+------------------+-----+---------+-------+ Left     Lt Pressure (mmHg)IndexWaveform Comment +---------+------------------+-----+---------+-------+ Brachial 137                                     +---------+------------------+-----+---------+-------+ ATA      136               0.98 triphasic        +---------+------------------+-----+---------+-------+ PTA      154               1.11 triphasic        +---------+------------------+-----+---------+-------+ PERO     148               1.06 triphasic        +---------+------------------+-----+---------+-------+ Great Toe110               0.79 Normal           +---------+------------------+-----+---------+-------+ +-------+-----------+-----------+------------+------------+ ABI/TBIToday's ABIToday's TBIPrevious ABIPrevious TBI +-------+-----------+-----------+------------+------------+ Right  1.03       .82        1.02        .92          +-------+-----------+-----------+------------+------------+ Left   1.11       .79        1.13        .69          +-------+-----------+-----------+------------+------------+ Bilateral ABIs and TBIs appear essentially unchanged compared  to prior study on 08/22/2019.  Summary: Right: Resting right ankle-brachial index is within normal range. No evidence of significant right lower extremity arterial disease. The right toe-brachial index is normal. Left: Resting left ankle-brachial index is within normal range. No evidence of significant left lower extremity arterial disease. The left toe-brachial index is normal.  *See table(s) above for measurements and observations.  Suggest follow up study in 6 months. Electronically signed by Jenkins Rouge MD on 10/24/2019 at 2:58:48 PM.    Final    VAS US AORTA/IVC/ILIACS  Result Date: 10/24/2019 ABDOMINAL AORTA STUDY Indications: Initially felt improvement in symptoms. In the last week he has              noticed claudication similar to before the stent, not as              significant, though. Risk Factors: Hypertension, no history of smoking. Other Factors: SEE ABI REPORT. Vascular Interventions: Right common iliac artery stent placed 08/15/2019.  Comparison Study: 08/22/2019 showed elevated velocities in the distal stent (205                   cm/sec) and distal to the stent (358 cm/sec), as well as in                   the left common iliac artery (242 cm/sec). Performing Technologist: [employer] BS, RVT, RDCS  Examination Guidelines: A complete evaluation includes B-mode imaging, spectral Doppler, color Doppler, and power Doppler as needed of all accessible portions of each vessel. Bilateral testing is considered an integral part of a complete examination. Limited examinations for reoccurring indications may be performed as noted.  Abdominal Aorta Findings: +-------------+-------+----------+----------+---------------+--------+--------+ Location     AP (cm)Trans (cm)PSV (cm/s)Waveform       ThrombusComments +-------------+-------+----------+----------+---------------+--------+--------+ Proximal                      83        biphasic                         +-------------+-------+----------+----------+---------------+--------+--------+ Distal                        162       biphasic                        +-------------+-------+----------+----------+---------------+--------+--------+ RT CIA Distal                 407       biphasic                        +-------------+-------+----------+----------+---------------+--------+--------+ RT EIA Prox                   150       barely biphasic                 +-------------+-------+----------+----------+---------------+--------+--------+ RT EIA Mid                    93        biphasic                        +-------------+-------+----------+----------+---------------+--------+--------+ RT EIA Distal                 105       biphasic                        +-------------+-------+----------+----------+---------------+--------+--------+  LT CIA Prox                   201       biphasic                        +-------------+-------+----------+----------+---------------+--------+--------+ LT CIA Mid                    283       biphasic                        +-------------+-------+----------+----------+---------------+--------+--------+ LT CIA Distal                 237       biphasic                        +-------------+-------+----------+----------+---------------+--------+--------+ LT EIA Prox                   134       biphasic                        +-------------+-------+----------+----------+---------------+--------+--------+ LT EIA Mid                    77        biphasic                        +-------------+-------+----------+----------+---------------+--------+--------+ LT EIA Distal                 106       biphasic                        +-------------+-------+----------+----------+---------------+--------+--------+ IVC/Iliac Findings: +--------+------+--------+--------+   IVC   PatentThrombusComments  +--------+------+--------+--------+ IVC Proxpatent                 +--------+------+--------+--------+    Right Stent(s): +-------------------+--------+--------+---------+------------------------------+ common iliac arteryPSV cm/sStenosisWaveform Comments                       +-------------------+--------+--------+---------+------------------------------+ Prox to Stent      146             biphasic                                +-------------------+--------+--------+---------+------------------------------+ Proximal Stent     224             triphasic                               +-------------------+--------+--------+---------+------------------------------+ Mid Stent          329             biphasic                                +-------------------+--------+--------+---------+------------------------------+ Distal Stent       368             biphasic                                +-------------------+--------+--------+---------+------------------------------+ Distal to Stent  407             biphasic far distal common iliac artery +-------------------+--------+--------+---------+------------------------------+ Turbulent flow with elevated velocities in the mid and distal stent and far distal common iliac artery.   Summary: Abdominal Aorta: No evidence of an abdominal aortic aneurysm was visualized. Stenosis: +------------------+-------------+---------------+ Location          Stenosis     Stent           +------------------+-------------+---------------+ Right Common Iliac>50% stenosis50-99% stenosis +------------------+-------------+---------------+ Left Common Iliac >50% stenosis                +------------------+-------------+---------------+ In-stent right common iliac artery restenosis and stenosis just distal to the stent.  *See table(s) above for measurements and observations. Suggest follow up study in 6 months.  Electronically signed by Jenkins Rouge MD on 10/24/2019 at 3:12:06 PM.    Final    Disposition   Pt is being discharged home today in good condition.  Follow-up Plans & Appointments    Follow-up Information    [facility] Follow up on 11/24/2019.   Specialty: Cardiology Why: at 10am for your follow up dopplers Contact information: [address] [place] [phone]       Lorretta Harp, MD Follow up on 11/29/2019.   Specialties: Cardiology, Radiology Why: at 3:45pm for your follow up appt.  Contact information: [address] [place] [address] [phone]          Discharge Instructions    AMB Referral to Cardiac Rehabilitation - Phase II   Complete by: As directed    Diagnosis: Other   After initial evaluation and assessments completed: Virtual Based Care may be provided alone or in conjunction with Phase 2 Cardiac Rehab based on patient barriers.: Yes   Diet - low sodium heart healthy   Complete by: As directed    Discharge instructions   Complete by: As directed    Groin Site Care Refer to this sheet in the next few weeks. These instructions provide you with information on caring for yourself after your procedure. Your caregiver may also give you more specific instructions. Your treatment has been planned according to current medical practices, but problems sometimes occur. Call your caregiver if you have any problems or questions after your procedure. HOME CARE INSTRUCTIONS You may shower 24 hours after the procedure. Remove the bandage (dressing) and gently wash the site with plain soap and water. Gently pat the site dry.  Do not apply powder or lotion to the site.  Do not sit in a bathtub, swimming pool, or whirlpool for 5 to 7 days.  No bending, squatting, or lifting anything over 10 pounds (4.5 kg) as directed by your caregiver.  Inspect the site at least twice daily.  Do not drive home if you are discharged the same day  of the procedure. Have someone else drive you.  You may drive 24 hours after the procedure unless otherwise instructed by your caregiver.  What to expect: Any bruising will usually fade within 1 to 2 weeks.  Blood that collects in the tissue (hematoma) may be painful to the touch. It should usually decrease in size and tenderness within 1 to 2 weeks.  SEEK IMMEDIATE MEDICAL CARE IF: You have unusual pain at the groin site or down the affected leg.  You have redness, warmth, swelling, or pain at the groin site.  You have drainage (other than a small amount of blood  on the dressing).  You have chills.  You have a fever or persistent symptoms for more than 72 hours.  You have a fever and your symptoms suddenly get worse.  Your leg becomes pale, cool, tingly, or numb.  You have heavy bleeding from the site. Hold pressure on the site. .   Increase activity slowly   Complete by: As directed        Discharge Medications     Medication List    STOP taking these medications   simvastatin 40 MG tablet Commonly known as: ZOCOR     TAKE these medications   amLODipine-valsartan 10-160 MG tablet Commonly known as: EXFORGE Take 1 tablet by mouth daily.   aspirin EC 81 MG tablet Take 81 mg by mouth at bedtime.   atorvastatin 80 MG tablet Commonly known as: LIPITOR Take 1 tablet (80 mg total) by mouth daily at 6 PM.   clopidogrel 75 MG tablet Commonly known as: PLAVIX Take 1 tablet (75 mg total) by mouth daily with breakfast.   Vitamin D3 50 MCG (2000 UT) capsule Take 2,000 Units by mouth daily.       No                               Did the patient have a percutaneous coronary intervention (stent / angioplasty)?:  No.      Outstanding Labs/Studies   Follow up dopplers.  Duration of Discharge Encounter   Greater than 30 minutes including physician time.  Signed, Reino Bellis NP-C 11/15/2019, 11:04 AM  I have personally seen and examined this patient. I agree with  the assessment and plan as outlined above.  Pt doing well this morning post PTA of the right iliac artery with stenting. Labs stable. Vitals stable. Discharge home today . Plan f/u with Dr. Gwenlyn Found.   Lauree Chandler 11/15/2019 11:04 AM

## 2019-11-15 ENCOUNTER — Encounter (HOSPITAL_COMMUNITY): Payer: Self-pay | Admitting: Cardiovascular Disease

## 2019-11-15 DIAGNOSIS — I340 Nonrheumatic mitral (valve) insufficiency: Secondary | ICD-10-CM | POA: Diagnosis not present

## 2019-11-15 DIAGNOSIS — I739 Peripheral vascular disease, unspecified: Secondary | ICD-10-CM

## 2019-11-15 DIAGNOSIS — I70211 Atherosclerosis of native arteries of extremities with intermittent claudication, right leg: Secondary | ICD-10-CM | POA: Diagnosis not present

## 2019-11-15 DIAGNOSIS — Z7982 Long term (current) use of aspirin: Secondary | ICD-10-CM | POA: Diagnosis not present

## 2019-11-15 DIAGNOSIS — Z7902 Long term (current) use of antithrombotics/antiplatelets: Secondary | ICD-10-CM | POA: Diagnosis not present

## 2019-11-15 LAB — BASIC METABOLIC PANEL
Anion gap: 7 (ref 5–15)
BUN: 21 mg/dL (ref 8–23)
CO2: 22 mmol/L (ref 22–32)
Calcium: 8.3 mg/dL — ABNORMAL LOW (ref 8.9–10.3)
Chloride: 109 mmol/L (ref 98–111)
Creatinine, Ser: 1.17 mg/dL (ref 0.61–1.24)
GFR calc Af Amer: >60 mL/min (ref >60)
GFR calc non Af Amer: 55 mL/min — ABNORMAL LOW (ref >60)
Glucose, Bld: 111 mg/dL — ABNORMAL HIGH (ref 70–99)
Potassium: 4.2 mmol/L (ref 3.5–5.1)
Sodium: 138 mmol/L (ref 135–145)

## 2019-11-15 LAB — CBC
HCT: 36.6 % — ABNORMAL LOW (ref 39.0–52.0)
Hemoglobin: 12 g/dL — ABNORMAL LOW (ref 13.0–17.0)
MCH: 29.8 pg (ref 26.0–34.0)
MCHC: 32.8 g/dL (ref 30.0–36.0)
MCV: 90.8 fL (ref 80.0–100.0)
Platelets: 170 10*3/uL (ref 150–400)
RBC: 4.03 MIL/uL — ABNORMAL LOW (ref 4.22–5.81)
RDW: 12.5 % (ref 11.5–15.5)
WBC: 10.6 10*3/uL — ABNORMAL HIGH (ref 4.0–10.5)
nRBC: 0 % (ref 0.0–0.2)

## 2019-11-15 MED ORDER — ATORVASTATIN CALCIUM 80 MG PO TABS: 80.0000 mg | ORAL_TABLET | Freq: Every day | ORAL | 1 refills | Status: DC

## 2019-11-15 NOTE — Progress Notes (Addendum)
MOBILITY TEAM - Progress Note   11/15/19 1400  Mobility  Activity Ambulated in hall  Level of Assistance Independent  Assistive Device None  Distance Ambulated (ft) 920 ft  Mobility Response Tolerated well   Pre-activity: HR 81 Post-activity: HR 102  Seen for second ambulation session. Pt moving well, no concerns.  Mabeline Caras, PT, DPT Mobility Team Pager [phone]

## 2019-11-15 NOTE — Progress Notes (Signed)
Discharge AVS meds take and those due reviewed with pt. Follow up appointments and when to call MD reviewed. All questions and concerns addressed. No further questions at this time. D/c IV and TELE, CCMD notified. D/C home per orders. Discharge AVS meds take and those due reviewed with pt. Follow up appointments and when to call MD reviewed. All questions and concerns addressed. No further questions at this time. D/c IV and TELE, CCMD notified. D/C home per orders.

## 2019-11-15 NOTE — Progress Notes (Signed)
MOBILITY TEAM - Progress Note   11/15/19 0900  Mobility  Activity Ambulated in hall  Level of Assistance Independent  Assistive Device None  Distance Ambulated (ft) 940 ft  Mobility Response Tolerated well   Pre-activity: HR 78 Post-activity: HR 98  Ipad video interpreter utilized, although pt hard of hearing making this difficult; follows gestural commands well and able to make basic needs known. Ambulating independently without instability; VSS on RA.  Mabeline Caras, PT, DPT Mobility Team Pager [phone]

## 2019-11-24 ENCOUNTER — Other Ambulatory Visit: Payer: Self-pay | Admitting: Cardiovascular Disease

## 2019-11-24 ENCOUNTER — Ambulatory Visit (HOSPITAL_COMMUNITY)
Admission: RE | Admit: 2019-11-24 | Discharge: 2019-11-24 | Disposition: A | Discharge status: Home / Self Care | Payer: Medicare HMO | Source: Ambulatory Visit | Attending: Cardiology | Admitting: Cardiology

## 2019-11-24 ENCOUNTER — Ambulatory Visit (HOSPITAL_BASED_OUTPATIENT_CLINIC_OR_DEPARTMENT_OTHER)
Admission: RE | Admit: 2019-11-24 | Discharge: 2019-11-24 | Disposition: A | Discharge status: Home / Self Care | Payer: Medicare HMO | Source: Ambulatory Visit | Attending: Cardiovascular Disease | Admitting: Cardiovascular Disease

## 2019-11-24 ENCOUNTER — Other Ambulatory Visit: Payer: Self-pay

## 2019-11-24 DIAGNOSIS — I739 Peripheral vascular disease, unspecified: Secondary | ICD-10-CM

## 2019-11-24 DIAGNOSIS — Z95828 Presence of other vascular implants and grafts: Secondary | ICD-10-CM

## 2019-11-25 ENCOUNTER — Telehealth: Payer: Self-pay

## 2019-11-25 DIAGNOSIS — I739 Peripheral vascular disease, unspecified: Secondary | ICD-10-CM

## 2019-11-25 NOTE — Telephone Encounter (Signed)
Spoke to patient's daughter [name] aorta/ivc/iliac doppler results given.Advised to repeat in 6 months.

## 2019-11-29 ENCOUNTER — Other Ambulatory Visit: Payer: Self-pay

## 2019-11-29 ENCOUNTER — Encounter: Payer: Self-pay | Admitting: Cardiovascular Disease

## 2019-11-29 ENCOUNTER — Ambulatory Visit: Payer: Medicare HMO | Admitting: Cardiovascular Disease

## 2019-11-29 DIAGNOSIS — I739 Peripheral vascular disease, unspecified: Secondary | ICD-10-CM

## 2019-11-29 NOTE — Assessment & Plan Note (Signed)
Danny Hudson returns after his recent endovascular procedure which I performed on 11/14/2019.  His initial procedure was performed 08/15/2019.  I stented his right common iliac artery with an excellent result however he did develop recurrent symptoms shortly after that and had Dopplers that showed increased velocities in his right iliac artery.  Based on this I restudied him on 11/14/2019 demonstrating moderate disease proximal to and distal to the previously placed stent which were physiologically significant on pullback gradient.  I placed 2 nitinol self-expanding stents in both areas resulting in improvement in his Doppler studies and resolution of his claudication.

## 2019-11-29 NOTE — Progress Notes (Signed)
11/29/2019 [name]   03-01-1928  [id number]  Primary Physician Jani Gravel, MD Primary Cardiologist: Lorretta Harp MD [name], [place]  HPI:  Danny Hudson is a 86 y.o.   thin appearing married [place] male father of 49, grandfather of 7 grandchildren is accompanied by his daughter [name] today. He is referred by Dr. Maudie Mercury, his PCP, for evaluation of peripheral arterial disease. His cardiologist is Dr. Percival Spanish who follows him for moderate mitral regurgitation. He does have a history of hypertension and hyperlipidemia.  I last saw him in the office 10/21/2019.He has had right greater than left lower extremity claudication for the last 6 months which which is somewhat lifestyle limiting. He had Doppler studies performed 06/29/2019 revealing a right ABI 0.67 in the left of 0.93. There is no evidence of critical limb ischemia. He denies chest pain or shortness of breath  His Doppler suggested a right iliac lesion.  Based on this I performed peripheral angiography on him 08/15/2019 revealing a high-grade right iliac lesion which I stented using a VBX covered stent.  He was discharged home the following day.  Lower extremity arterial Doppler studies performed 08/22/2019 revealed his right iliac to be widely patent.  He did enjoy improvement in his claudication.  He had a femoral duplex performed a week later to rule out pseudoaneurysm which was negative.  Apparently over the last week or so has had recurrent claudication symptoms symptoms similar to his preintervention symptoms.  Based on his Doppler studies and symptoms I restudied him on 11/14/2019 demonstrating moderate disease proximal to and distal to the previously placed stent.  These were physiologically significant on pullback with an endhole catheter and intraarterial nitroglycerin.  Based on this I restented both areas with nitinol self-expanding stents with an excellent result.  Follow-up Doppler studies showed improvement and his  claudication has resolved.    Current Meds  Medication Sig  . amLODipine-valsartan (EXFORGE) 10-160 MG tablet Take 1 tablet by mouth daily.  Marland Kitchen aspirin EC 81 MG tablet Take 81 mg by mouth at bedtime.  Marland Kitchen atorvastatin (LIPITOR) 80 MG tablet Take 1 tablet (80 mg total) by mouth daily at 6 PM.  . Cholecalciferol (VITAMIN D3) 50 MCG (2000 UT) capsule Take 2,000 Units by mouth daily.   . clopidogrel (PLAVIX) 75 MG tablet Take 1 tablet (75 mg total) by mouth daily with breakfast.     Not on File  Social History   Socioeconomic History  . Marital status: Married    Spouse name: Not on file  . Number of children: 3  . Years of education: Not on file  . Highest education level: Not on file  Occupational History  . Not on file  Tobacco Use  . Smoking status: Never Smoker  . Smokeless tobacco: Never Used  Substance and Sexual Activity  . Alcohol use: Not on file  . Drug use: Not on file  . Sexual activity: Not on file  Other Topics Concern  . Not on file  Social History Narrative   Exercises routinely.   Lives with daughter.     Social Determinants of Health   Financial Resource Strain:   . Difficulty of Paying Living Expenses:   Food Insecurity:   . Worried About Charity fundraiser in the Last Year:   . Arboriculturist in the Last Year:   Transportation Needs:   . Film/video editor (Medical):   Marland Kitchen Lack of Transportation (Non-Medical):  Physical Activity:   . Days of Exercise per Week:   . Minutes of Exercise per Session:   Stress:   . Feeling of Stress :   Social Connections:   . Frequency of Communication with Friends and Family:   . Frequency of Social Gatherings with Friends and Family:   . Attends Religious Services:   . Active Member of Clubs or Organizations:   . Attends Archivist Meetings:   Marland Kitchen Marital Status:   Intimate Partner Violence:   . Fear of Current or Ex-Partner:   . Emotionally Abused:   Marland Kitchen Physically Abused:   . Sexually Abused:        Review of Systems: General: negative for chills, fever, night sweats or weight changes.  Cardiovascular: negative for chest pain, dyspnea on exertion, edema, orthopnea, palpitations, paroxysmal nocturnal dyspnea or shortness of breath Dermatological: negative for rash Respiratory: negative for cough or wheezing Urologic: negative for hematuria Abdominal: negative for nausea, vomiting, diarrhea, bright red blood per rectum, melena, or hematemesis Neurologic: negative for visual changes, syncope, or dizziness All other systems reviewed and are otherwise negative except as noted above.    Blood pressure (!) 148/56, pulse 72, height 5\' 2"  (1.575 m), weight 140 lb (63.5 kg), SpO2 96 %.  General appearance: alert and no distress Neck: no adenopathy, no carotid bruit, no JVD, supple, symmetrical, trachea midline and thyroid not enlarged, symmetric, no tenderness/mass/nodules Lungs: clear to auscultation bilaterally Heart: regular rate and rhythm, S1, S2 normal, no murmur, click, rub or gallop Extremities: extremities normal, atraumatic, no cyanosis or edema Pulses: 2+ and symmetric Skin: Skin color, texture, turgor normal. No rashes or lesions Neurologic: Alert and oriented X 3, normal strength and tone. Normal symmetric reflexes. Normal coordination and gait  EKG not performed today  ASSESSMENT AND PLAN:   Claudication in peripheral vascular disease [facility]) Mr Pinson returns after his recent endovascular procedure which I performed on 11/14/2019.  His initial procedure was performed 08/15/2019.  I stented his right common iliac artery with an excellent result however he did develop recurrent symptoms shortly after that and had Dopplers that showed increased velocities in his right iliac artery.  Based on this I restudied him on 11/14/2019 demonstrating moderate disease proximal to and distal to the previously placed stent which were physiologically significant on pullback gradient.  I placed 2  nitinol self-expanding stents in both areas resulting in improvement in his Doppler studies and resolution of his claudication.      Lorretta Harp MD FACP,FACC,FAHA, [facility] 11/29/2019 4:11 PM

## 2019-11-29 NOTE — Patient Instructions (Signed)
Medication Instructions:  NO CHANGE *If you need a refill on your cardiac medications before your next appointment, please call your pharmacy*   Lab Work: If you have labs (blood work) drawn today and your tests are completely normal, you will receive your results only by: Marland Kitchen MyChart Message (if you have MyChart) OR . A paper copy in the mail If you have any lab test that is abnormal or we need to change your treatment, we will call you to review the results.   Follow-Up: At [facility], you and your health needs are our priority.  As part of our continuing mission to provide you with exceptional heart care, we have created designated Provider Care Teams.  These Care Teams include your primary Cardiologist (physician) and Advanced Practice Providers (APPs -  Physician Assistants and Nurse Practitioners) who all work together to provide you with the care you need, when you need it.  We recommend signing up for the patient portal called "MyChart".  Sign up information is provided on this After Visit Summary.  MyChart is used to connect with patients for Virtual Visits (Telemedicine).  Patients are able to view lab/test results, encounter notes, upcoming appointments, etc.  Non-urgent messages can be sent to your provider as well.   To learn more about what you can do with MyChart, go to [email or web address].    Your next appointment:   6 month(s)  The format for your next appointment:   In Person  Provider:   You may see Quay Burow, MD or one of the following Advanced Practice Providers on your designated Care Team:    Kerin Ransom, PA-C  [place], [place]  Coletta Memos, [place]

## 2020-01-18 ENCOUNTER — Other Ambulatory Visit: Payer: Self-pay | Admitting: Cardiovascular Disease

## 2020-01-18 MED ORDER — ATORVASTATIN CALCIUM 80 MG PO TABS: 80.0000 mg | ORAL_TABLET | Freq: Every day | ORAL | 1 refills | Status: DC

## 2020-01-18 NOTE — Telephone Encounter (Signed)
New message       *STAT* If patient is at the pharmacy, call can be transferred to refill team.   1. Which medications need to be refilled? (please list name of each medication and dose if known)atorvastatin (LIPITOR) 80 MG tablet  2. Which pharmacy/location (including street and city if local pharmacy) is medication to be sent to?[facility] [place], [place]  3. Do they need a 30 day or 90 day supply? [place]

## 2020-05-03 ENCOUNTER — Telehealth: Payer: Self-pay | Admitting: Cardiovascular Disease

## 2020-05-03 NOTE — Telephone Encounter (Signed)
Returned call to patient's daughter [name].She stated father has increased swelling in left leg from knee down and swelling in right ankle.Stated left leg is discolored and hard to touch.Stated home RN saw father today and advised he needs to see Dr.Berry soon. No sob.Weight stable.Dr.Berry has a cancellation tomorrow 8/20.Appointment scheduled with Dr.Berry 8/20 at 10:00 am.

## 2020-05-03 NOTE — Telephone Encounter (Signed)
Pt c/o swelling: STAT is pt has developed SOB within 24 hours  1) How much weight have you gained and in what time span? Unsure   2) If swelling, where is the swelling located? From knee to foot on the left side. For right side, it's just around ankle.   3) Are you currently taking a fluid pill? Yes, but it is not helping  4) Are you currently SOB? No  5) Do you have a log of your daily weights (if so, list)? No   6) Have you gained 3 pounds in a day or 5 pounds in a week? Unsure   7) Have you traveled recently? No

## 2020-05-04 ENCOUNTER — Encounter: Payer: Self-pay | Admitting: Cardiovascular Disease

## 2020-05-04 ENCOUNTER — Ambulatory Visit: Payer: Medicare HMO | Admitting: Cardiovascular Disease

## 2020-05-04 ENCOUNTER — Telehealth: Payer: Self-pay | Admitting: Cardiovascular Disease

## 2020-05-04 ENCOUNTER — Other Ambulatory Visit: Payer: Self-pay

## 2020-05-04 DIAGNOSIS — R600 Localized edema: Secondary | ICD-10-CM | POA: Diagnosis not present

## 2020-05-04 DIAGNOSIS — I10 Essential (primary) hypertension: Secondary | ICD-10-CM | POA: Diagnosis not present

## 2020-05-04 DIAGNOSIS — I739 Peripheral vascular disease, unspecified: Secondary | ICD-10-CM | POA: Diagnosis not present

## 2020-05-04 MED ORDER — AMLODIPINE BESYLATE-VALSARTAN 5-160 MG PO TABS: 1.0000 | ORAL_TABLET | Freq: Every day | ORAL | 1 refills | Status: DC

## 2020-05-04 MED ORDER — FUROSEMIDE 40 MG PO TABS: 40.0000 mg | ORAL_TABLET | Freq: Every day | ORAL | 1 refills | Status: DC

## 2020-05-04 NOTE — Telephone Encounter (Signed)
Follow up:     Patient wife calling concering patient medication refill.

## 2020-05-04 NOTE — Assessment & Plan Note (Signed)
History of essential hypertension blood pressure measured today 122/60.  He is on amlodipine and valsartan.  I am going to decrease the amlodipine component from 10 to 5 mg because of lower extremity edema.

## 2020-05-04 NOTE — Progress Notes (Signed)
05/04/2020 Danny Hudson   02-08-1928  [id number]  Primary Physician Jani Gravel, MD Primary Cardiologist: Lorretta Harp MD [name], [place]  HPI:  Danny Hudson is a 86 y.o.  thin appearing married [place] male father of 62, grandfather of 7 grandchildren is accompanied by his daughter Danny Hudson today. He is referred by Dr. Maudie Mercury, his PCP, for evaluation of peripheral arterial disease. His cardiologist is Dr. Percival Spanish who follows him for moderate mitral regurgitation. He does have a history of hypertension and hyperlipidemia.I last saw him in the office  11/29/2019.He has had right greater than left lower extremity claudication for the last 6 months which which is somewhat lifestyle limiting. He had Doppler studies performed 06/29/2019 revealing a right ABI 0.67 in the left of 0.93. There is no evidence of critical limb ischemia. He denies chest pain or shortness of breath  His Doppler suggested a right iliac lesion. Based on this I performed peripheral angiography on him 08/15/2019 revealing a high-grade right iliac lesion which I stented using a VBX covered stent. He was discharged home the following day. Lower extremity arterial Doppler studies performed 08/22/2019 revealed his right iliac to be widely patent. He did enjoy improvement in his claudication. He had a femoral duplex performed a week later to rule out pseudoaneurysm which was negative. Apparently over the last week or so has had recurrent claudication symptoms symptoms similar to his preintervention symptoms.  Based on his Doppler studies and symptoms I restudied him on 11/14/2019 demonstrating moderate disease proximal to and distal to the previously placed stent.  These were physiologically significant on pullback with an endhole catheter and intraarterial nitroglycerin.  Based on this I restented both areas with nitinol self-expanding stents with an excellent result.  Follow-up Doppler studies showed improvement and  his claudication has resolved.  Since I saw him 6 months ago he developed edema in both extremities for unclear reasons.  He denies shortness of breath.  Does have a history of MR on prior 2D echocardiograms however.  He also denies claudication.    Current Meds  Medication Sig  . amLODipine-valsartan (EXFORGE) 10-160 MG tablet Take 1 tablet by mouth daily.  Marland Kitchen aspirin EC 81 MG tablet Take 81 mg by mouth at bedtime.  Marland Kitchen atorvastatin (LIPITOR) 80 MG tablet Take 1 tablet (80 mg total) by mouth daily at 6 PM.  . Cholecalciferol (VITAMIN D3) 50 MCG (2000 UT) capsule Take 2,000 Units by mouth daily.   . clopidogrel (PLAVIX) 75 MG tablet Take 1 tablet (75 mg total) by mouth daily with breakfast.  . furosemide (LASIX) 20 MG tablet Take 20 mg by mouth daily.     No Known Allergies  Social History   Socioeconomic History  . Marital status: Married    Spouse name: Not on file  . Number of children: 3  . Years of education: Not on file  . Highest education level: Not on file  Occupational History  . Not on file  Tobacco Use  . Smoking status: Never Smoker  . Smokeless tobacco: Never Used  Substance and Sexual Activity  . Alcohol use: Not on file  . Drug use: Not on file  . Sexual activity: Not on file  Other Topics Concern  . Not on file  Social History Narrative   Exercises routinely.   Lives with daughter.     Social Determinants of Health   Financial Resource Strain:   . Difficulty of Paying Living Expenses: Not on  file  Food Insecurity:   . Worried About Charity fundraiser in the Last Year: Not on file  . Ran Out of Food in the Last Year: Not on file  Transportation Needs:   . Lack of Transportation (Medical): Not on file  . Lack of Transportation (Non-Medical): Not on file  Physical Activity:   . Days of Exercise per Week: Not on file  . Minutes of Exercise per Session: Not on file  Stress:   . Feeling of Stress : Not on file  Social Connections:   . Frequency of  Communication with Friends and Family: Not on file  . Frequency of Social Gatherings with Friends and Family: Not on file  . Attends Religious Services: Not on file  . Active Member of Clubs or Organizations: Not on file  . Attends Archivist Meetings: Not on file  . Marital Status: Not on file  Intimate Partner Violence:   . Fear of Current or Ex-Partner: Not on file  . Emotionally Abused: Not on file  . Physically Abused: Not on file  . Sexually Abused: Not on file     Review of Systems: General: negative for chills, fever, night sweats or weight changes.  Cardiovascular: negative for chest pain, dyspnea on exertion, edema, orthopnea, palpitations, paroxysmal nocturnal dyspnea or shortness of breath Dermatological: negative for rash Respiratory: negative for cough or wheezing Urologic: negative for hematuria Abdominal: negative for nausea, vomiting, diarrhea, bright red blood per rectum, melena, or hematemesis Neurologic: negative for visual changes, syncope, or dizziness All other systems reviewed and are otherwise negative except as noted above.    Blood pressure 122/60, pulse 73, height 5\' 2"  (1.575 m), weight 134 lb 6.4 oz (61 kg), SpO2 97 %.  General appearance: alert and no distress Neck: no adenopathy, no carotid bruit, no JVD, supple, symmetrical, trachea midline and thyroid not enlarged, symmetric, no tenderness/mass/nodules Lungs: clear to auscultation bilaterally Heart: regular rate and rhythm, S1, S2 normal, no murmur, click, rub or gallop Extremities: 2-3+ pitting edema bilaterally Pulses: 2+ and symmetric Skin: Erythematous changes left lower extremity Neurologic: Alert and oriented X 3, normal strength and tone. Normal symmetric reflexes. Normal coordination and gait  EKG not performed today  ASSESSMENT AND PLAN:   Essential hypertension History of essential hypertension blood pressure measured today 122/60.  He is on amlodipine and valsartan.  I am  going to decrease the amlodipine component from 10 to 5 mg because of lower extremity edema.  Peripheral arterial disease ([place]) History of peripheral arterial disease status post right common iliac artery stenting by myself 08/14/2022 intervention 11/14/2019 for recurrent disease and subsequent Doppler studies performed 11/24/2019 revealing his stents to be widely patent with normal ABIs.  He denies claudication.  We will repeat lower extremity arterial Doppler studies in March of next year.  Lower extremity edema Bilateral lower extremity edema over the last several months for no apparent reason.  Does have some erythema/potential cellulitis on the left.  I am going to decrease his amlodipine dose from 10 to 5 mg a day and double his furosemide dose from 20 to 40 mg a day.  We talked about leg elevation.  I will check a basic metabolic panel in 2 weeks.  I am also going to check a 2D echocardiogram.      Lorretta Harp MD [facility], [facility] 05/04/2020 10:26 AM

## 2020-05-04 NOTE — Assessment & Plan Note (Signed)
History of peripheral arterial disease status post right common iliac artery stenting by myself 08/14/2022 intervention 11/14/2019 for recurrent disease and subsequent Doppler studies performed 11/24/2019 revealing his stents to be widely patent with normal ABIs.  He denies claudication.  We will repeat lower extremity arterial Doppler studies in March of next year.

## 2020-05-04 NOTE — Telephone Encounter (Signed)
Spoke with pt daughter, questions regarding lab work and medications answered.

## 2020-05-04 NOTE — Patient Instructions (Signed)
Medication Instructions:  Decrease Amlodipine-Valsartan to 5-160 mg  Increase Furosemide to 40 mg daily   *If you need a refill on your cardiac medications before your next appointment, please call your pharmacy*   Lab Work: BMET (in 2 weeks, no lab appointment needed)  If you have labs (blood work) drawn today and your tests are completely normal, you will receive your results only by: Marland Kitchen MyChart Message (if you have MyChart) OR . A paper copy in the mail If you have any lab test that is abnormal or we need to change your treatment, we will call you to review the results.   Testing/Procedures: AORTA/ILLIAC in March 2022  Echocardiogram - Your physician has requested that you have an echocardiogram. Echocardiography is a painless test that uses sound waves to create images of your heart. It provides your doctor with information about the size and shape of your heart and how well your heart's chambers and valves are working. This procedure takes approximately one hour. There are no restrictions for this procedure. This will be performed at our [facility] location - [address], [address].    Follow-Up: At [facility], you and your health needs are our priority.  As part of our continuing mission to provide you with exceptional heart care, we have created designated Provider Care Teams.  These Care Teams include your primary Cardiologist (physician) and Advanced Practice Providers (APPs -  Physician Assistants and Nurse Practitioners) who all work together to provide you with the care you need, when you need it.  We recommend signing up for the patient portal called "MyChart".  Sign up information is provided on this After Visit Summary.  MyChart is used to connect with patients for Virtual Visits (Telemedicine).  Patients are able to view lab/test results, encounter notes, upcoming appointments, etc.  Non-urgent messages can be sent to your provider as well.   To learn more about what  you can do with MyChart, go to [email or web address].    Your next appointment:   4-6 weeks with APP (PA, NP) 3 months with Dr.Berry

## 2020-05-04 NOTE — Assessment & Plan Note (Signed)
Bilateral lower extremity edema over the last several months for no apparent reason.  Does have some erythema/potential cellulitis on the left.  I am going to decrease his amlodipine dose from 10 to 5 mg a day and double his furosemide dose from 20 to 40 mg a day.  We talked about leg elevation.  I will check a basic metabolic panel in 2 weeks.  I am also going to check a 2D echocardiogram.

## 2020-05-14 ENCOUNTER — Ambulatory Visit (HOSPITAL_COMMUNITY): Payer: Medicare HMO | Attending: Cardiology

## 2020-05-14 ENCOUNTER — Other Ambulatory Visit: Payer: Self-pay

## 2020-05-14 DIAGNOSIS — R600 Localized edema: Secondary | ICD-10-CM | POA: Diagnosis not present

## 2020-05-14 DIAGNOSIS — I739 Peripheral vascular disease, unspecified: Secondary | ICD-10-CM | POA: Diagnosis not present

## 2020-05-14 LAB — ECHOCARDIOGRAM COMPLETE
Area-P 1/2: 3.46 cm2
S' Lateral: 2.5 cm

## 2020-05-18 LAB — BASIC METABOLIC PANEL
BUN/Creatinine Ratio: 16 (ref 10–24)
BUN: 16 mg/dL (ref 10–36)
CO2: 24 mmol/L (ref 20–29)
Calcium: 8.8 mg/dL (ref 8.6–10.2)
Chloride: 103 mmol/L (ref 96–106)
Creatinine, Ser: 1.02 mg/dL (ref 0.76–1.27)
GFR calc Af Amer: 74 mL/min/{1.73_m2} (ref >59)
GFR calc non Af Amer: 64 mL/min/{1.73_m2} (ref >59)
Glucose: 103 mg/dL — ABNORMAL HIGH (ref 65–99)
Potassium: 4.2 mmol/L (ref 3.5–5.2)
Sodium: 141 mmol/L (ref 134–144)

## 2020-05-26 ENCOUNTER — Encounter: Payer: Self-pay | Admitting: *Deleted

## 2020-05-28 ENCOUNTER — Ambulatory Visit: Payer: Medicare HMO | Admitting: Cardiovascular Disease

## 2020-05-29 ENCOUNTER — Ambulatory Visit: Payer: Medicare HMO | Admitting: Cardiology

## 2020-06-01 ENCOUNTER — Encounter: Payer: Self-pay | Admitting: Physician Assistant

## 2020-06-01 ENCOUNTER — Ambulatory Visit: Payer: Medicare HMO | Admitting: Physician Assistant

## 2020-06-01 ENCOUNTER — Other Ambulatory Visit: Payer: Self-pay

## 2020-06-01 VITALS — BP 112/64 | HR 74 | Ht 62.0 in | Wt 130.2 lb

## 2020-06-01 DIAGNOSIS — I340 Nonrheumatic mitral (valve) insufficiency: Secondary | ICD-10-CM

## 2020-06-01 DIAGNOSIS — R600 Localized edema: Secondary | ICD-10-CM | POA: Diagnosis not present

## 2020-06-01 DIAGNOSIS — I739 Peripheral vascular disease, unspecified: Secondary | ICD-10-CM | POA: Diagnosis not present

## 2020-06-01 DIAGNOSIS — E785 Hyperlipidemia, unspecified: Secondary | ICD-10-CM

## 2020-06-01 DIAGNOSIS — I10 Essential (primary) hypertension: Secondary | ICD-10-CM | POA: Diagnosis not present

## 2020-06-01 NOTE — Patient Instructions (Signed)
Medication Instructions:  Your physician recommends that you continue on your current medications as directed. Please refer to the Current Medication list given to you today.  *If you need a refill on your cardiac medications before your next appointment, please call your pharmacy*  Lab Work: NONE ordered at this time of appointment   If you have labs (blood work) drawn today and your tests are completely normal, you will receive your results only by: Marland Kitchen MyChart Message (if you have MyChart) OR . A paper copy in the mail If you have any lab test that is abnormal or we need to change your treatment, we will call you to review the results.  Testing/Procedures: NONE ordered at this time of appointment   Follow-Up: At [facility], you and your health needs are our priority.  As part of our continuing mission to provide you with exceptional heart care, we have created designated Provider Care Teams.  These Care Teams include your primary Cardiologist (physician) and Advanced Practice Providers (APPs -  Physician Assistants and Nurse Practitioners) who all work together to provide you with the care you need, when you need it.  We recommend signing up for the patient portal called "MyChart".  Sign up information is provided on this After Visit Summary.  MyChart is used to connect with patients for Virtual Visits (Telemedicine).  Patients are able to view lab/test results, encounter notes, upcoming appointments, etc.  Non-urgent messages can be sent to your provider as well.   To learn more about what you can do with MyChart, go to [email or web address].    Your next appointment:   Follow as scheduled    The format for your next appointment:   In Person  Provider:   Minus Breeding, MD  Other Instructions

## 2020-06-01 NOTE — Progress Notes (Signed)
Cardiology Office Note:    Date:  06/03/2020   ID:  Danny Hudson, DOB [birth year], MRN [id number]  PCP:  Jani Gravel, MD  [facility] Cardiologist:  Minus Breeding, MD  [place] Electrophysiologist:  None  PV specialist: Dr. Quay Burow  Referring MD: Jani Gravel, MD   Chief Complaint  Patient presents with  . Follow-up    seen for Dr. Gwenlyn Found    History of Present Illness:    Danny Hudson is a 86 y.o. male with a hx of moderate MR, PAD, hypertension and hyperlipidemia.  Previous ABI obtained in October 2020 showed right ABI 0.67, left ABI 0.93.  Doppler suggested right iliac lesion.  Lower examinee angiography performed on 08/15/2019 revealed a high-grade right iliac lesion treated with a VBX covered stent.  Follow-up arterial Doppler obtained in December 2020 revealed widely patent right iliac stent.  He underwent repeat lower extremity angiography on 11/14/2019 that demonstrated moderate disease proximal to and distal to the previously placed stent.  Both areas were treated with nitrol self-expanding stents.  Follow-up Doppler showed improvement, his claudication symptom was also resolved.  He was last seen by Dr. Alvester Chou on 05/05/2019 for lower extremity edema.  Amlodipine was decreased to 5 mg daily.  Lasix was increased to 40 mg a day.  Echocardiogram obtained on 05/14/2020 showed EF 60 to 65%, normal pulmonary artery systolic pressure, mild MR.  Patient presents today along with her daughter.  He has not had any recent significant shortness of breath.  His weight is down 4 pounds.  His daughter is still concerned about lower extremity edema, however I do not see significant edema on exam.  He has some erythema of the lower extremity for which I am concerned of cellulitis.  However his daughter recently took him to a dermatologist, they thought it was a topic dermatitis and venous stasis.  He has been treated with cream and topical steroid, his daughter says lower extremity erythema has  significantly improved.  He does have diffuse bruising as well, this is likely related to his advanced age and the fact he is on both aspirin and Plavix.  Since his last lower extremity intervention was about 6 months ago, I will double check with Dr. Gwenlyn Found to see if he can potentially come off of Plavix.  Otherwise, he is doing well from cardiology perspective and can follow-up with Dr. Percival Spanish as previously scheduled.  He is also due for repeat lower extremity ABI with arterial Doppler in March 2022, he will need a follow-up with Dr. Gwenlyn Found after that.   Past Medical History:  Diagnosis Date  . ALLERGIC RHINITIS 01/21/2007   Qualifier: Diagnosis of  By: Larose Kells MD, [place] Gout of ankle 03/21/2015  . HEMORRHOIDS, INTERNAL W/O COMPLICATION 9/62/9528   Qualifier: Diagnosis of  By: Larose Kells MD, [place] HYPERTENSION 01/21/2007   Qualifier: Diagnosis of  By: Larose Kells MD, [place]     Past Surgical History:  Procedure Laterality Date  . ABDOMINAL AORTOGRAM W/LOWER EXTREMITY Bilateral 08/15/2019   Procedure: ABDOMINAL AORTOGRAM W/LOWER EXTREMITY;  Surgeon: Lorretta Harp, MD;  Location: [place] [facility];  Service: Cardiovascular;  Laterality: Bilateral;  . ABDOMINAL AORTOGRAM W/LOWER EXTREMITY N/A 11/14/2019   Procedure: ABDOMINAL AORTOGRAM W/LOWER EXTREMITY;  Surgeon: Lorretta Harp, MD;  Location: [place] [facility];  Service: Cardiovascular;  Laterality: N/A;  . CATARACT EXTRACTION    . PERIPHERAL VASCULAR INTERVENTION Right 11/14/2019   Procedure: PERIPHERAL  VASCULAR INTERVENTION;  Surgeon: Lorretta Harp, MD;  Location: [place] [facility];  Service: Cardiovascular;  Laterality: Right;    Current Medications: Current Meds  Medication Sig  . amLODipine-valsartan (EXFORGE) 5-160 MG tablet Take 1 tablet by mouth daily.  Marland Kitchen aspirin EC 81 MG tablet Take 81 mg by mouth at bedtime.  Marland Kitchen atorvastatin (LIPITOR) 80 MG tablet Take 1 tablet (80 mg total) by mouth daily at 6 PM.  . augmented betamethasone  dipropionate (DIPROLENE-AF) 0.05 % ointment Apply topically.  . Cholecalciferol (VITAMIN D3) 50 MCG (2000 UT) capsule Take 2,000 Units by mouth daily.   . clopidogrel (PLAVIX) 75 MG tablet Take 1 tablet (75 mg total) by mouth daily with breakfast.  . furosemide (LASIX) 40 MG tablet Take 1 tablet (40 mg total) by mouth daily.  Marland Kitchen triamcinolone cream (KENALOG) 0.1 % SMARTSIG:1 Application Topical 2-3 Times Daily     Allergies:   Patient has no known allergies.   Social History   Socioeconomic History  . Marital status: Married    Spouse name: Not on file  . Number of children: 3  . Years of education: Not on file  . Highest education level: Not on file  Occupational History  . Not on file  Tobacco Use  . Smoking status: Never Smoker  . Smokeless tobacco: Never Used  Substance and Sexual Activity  . Alcohol use: Not on file  . Drug use: Not on file  . Sexual activity: Not on file  Other Topics Concern  . Not on file  Social History Narrative   Exercises routinely.   Lives with daughter.     Social Determinants of Health   Financial Resource Strain:   . Difficulty of Paying Living Expenses: Not on file  Food Insecurity:   . Worried About Charity fundraiser in the Last Year: Not on file  . Ran Out of Food in the Last Year: Not on file  Transportation Needs:   . Lack of Transportation (Medical): Not on file  . Lack of Transportation (Non-Medical): Not on file  Physical Activity:   . Days of Exercise per Week: Not on file  . Minutes of Exercise per Session: Not on file  Stress:   . Feeling of Stress : Not on file  Social Connections:   . Frequency of Communication with Friends and Family: Not on file  . Frequency of Social Gatherings with Friends and Family: Not on file  . Attends Religious Services: Not on file  . Active Member of Clubs or Organizations: Not on file  . Attends Archivist Meetings: Not on file  . Marital Status: Not on file     Family  History: The patient's family history includes Sudden death (age of onset: 97) in his father.  ROS:   Please see the history of present illness.     All other systems reviewed and are negative.  EKGs/Labs/Other Studies Reviewed:    The following studies were reviewed today:  Echo 05/14/2020 1. Left ventricular ejection fraction, by estimation, is 60 to 65%. The  left ventricle has normal function. The left ventricle has no regional  wall motion abnormalities. There is mild left ventricular hypertrophy.  Left ventricular diastolic parameters  are indeterminate.  2. Right ventricular systolic function is normal. The right ventricular  size is normal. There is normal pulmonary artery systolic pressure. The  estimated right ventricular systolic pressure is 91.6 mmHg.  3. The mitral valve is normal in structure. Mild  mitral valve  regurgitation.  4. The aortic valve was not well visualized. Aortic valve regurgitation  is trivial. No aortic stenosis is present.  5. The inferior vena cava is normal in size with greater than 50%  respiratory variability, suggesting right atrial pressure of 3 mmHg.   EKG:  EKG is not ordered today.    Recent Labs: 11/15/2019: Hemoglobin 12.0; Platelets 170 05/18/2020: BUN 16; Creatinine, Ser 1.02; Potassium 4.2; Sodium 141  Recent Lipid Panel    Component Value Date/Time   CHOL 183 01/05/2007 0814   TRIG 130 01/05/2007 0814   HDL 37.4 (L) 01/05/2007 0814   CHOLHDL 4.9 CALC 01/05/2007 0814   VLDL 26 01/05/2007 0814   LDLCALC 120 (H) 01/05/2007 0814    Physical Exam:    VS:  BP 112/64   Pulse 74   Ht 5\' 2"  (1.575 m)   Wt 130 lb 3.2 oz (59.1 kg)   SpO2 96%   BMI 23.81 kg/m     Wt Readings from Last 3 Encounters:  06/01/20 130 lb 3.2 oz (59.1 kg)  05/04/20 134 lb 6.4 oz (61 kg)  11/29/19 140 lb (63.5 kg)     GEN:  Well nourished, well developed in no acute distress HEENT: Normal NECK: No JVD; No carotid bruits LYMPHATICS: No  lymphadenopathy CARDIAC: RRR, no murmurs, rubs, gallops RESPIRATORY:  Clear to auscultation without rales, wheezing or rhonchi  ABDOMEN: Soft, non-tender, non-distended MUSCULOSKELETAL:  No edema; No deformity  SKIN: Warm and dry NEUROLOGIC:  Alert and oriented x 3 PSYCHIATRIC:  Normal affect   ASSESSMENT:    1. Leg edema   2. Nonrheumatic mitral valve regurgitation   3. PAD (peripheral artery disease) ([place])   4. Essential hypertension   5. Hyperlipidemia LDL goal <70    PLAN:    In order of problems listed above:  1. Leg edema: During the last office visit, his amlodipine has been decreased to 5 mg daily Lasix increased to 40 mg daily.  Lower extremity edema has resolved, his weight is down 4 pounds.  He does have some erythema in bilateral lower extremity area, however he has been seen by dermatology service and felt this is likely related to atopic dermatitis versus venous stasis.  I did not feel significant warmth coming off of his legs to suggest cellulitis.  According to his daughter, lower extremity erythema has significantly improved after using the cream and topical steroid therapy his dermatologist has prescribed.  2. History of mitral valve regurgitation: Stable on last echocardiogram  3. PAD: Previously underwent right iliac stenting.  He does have significant bruising on both aspirin and Plavix.  Will check with Dr. Gwenlyn Found to see if he can potentially come off of Plavix by this point.  4. Hypertension: Blood pressure stable  5. Hyperlipidemia: On Lipitor   Medication Adjustments/Labs and Tests Ordered: Current medicines are reviewed at length with the patient today.  Concerns regarding medicines are outlined above.  No orders of the defined types were placed in this encounter.  No orders of the defined types were placed in this encounter.   Patient Instructions  Medication Instructions:  Your physician recommends that you continue on your current medications as  directed. Please refer to the Current Medication list given to you today.  *If you need a refill on your cardiac medications before your next appointment, please call your pharmacy*  Lab Work: NONE ordered at this time of appointment   If you have labs (blood work) drawn today  and your tests are completely normal, you will receive your results only by: Marland Kitchen MyChart Message (if you have MyChart) OR . A paper copy in the mail If you have any lab test that is abnormal or we need to change your treatment, we will call you to review the results.  Testing/Procedures: NONE ordered at this time of appointment   Follow-Up: At [facility], you and your health needs are our priority.  As part of our continuing mission to provide you with exceptional heart care, we have created designated Provider Care Teams.  These Care Teams include your primary Cardiologist (physician) and Advanced Practice Providers (APPs -  Physician Assistants and Nurse Practitioners) who all work together to provide you with the care you need, when you need it.  We recommend signing up for the patient portal called "MyChart".  Sign up information is provided on this After Visit Summary.  MyChart is used to connect with patients for Virtual Visits (Telemedicine).  Patients are able to view lab/test results, encounter notes, upcoming appointments, etc.  Non-urgent messages can be sent to your provider as well.   To learn more about what you can do with MyChart, go to [email or web address].    Your next appointment:   Follow as scheduled    The format for your next appointment:   In Person  Provider:   Minus Breeding, MD  Other Instructions      Signed, Almyra Deforest, [place]  06/03/2020 11:55 PM    [place]

## 2020-06-03 ENCOUNTER — Encounter: Payer: Self-pay | Admitting: Physician Assistant

## 2020-06-06 ENCOUNTER — Telehealth: Payer: Self-pay

## 2020-06-06 NOTE — Telephone Encounter (Signed)
Called the patient with the help of [place] ID# [phone] and left a voice message for patient with the information left from staff message from [facility], [place]...  Almyra Deforest, [place]  Jacqulynn Cadet, CMA Please inform the patient to stop the plavix after finishing the current bottle in order to decrease bruising. You may have to talk to her daughter who is the translator of the family. The patient primarily speak [place].

## 2020-06-13 ENCOUNTER — Telehealth: Payer: Self-pay | Admitting: *Deleted

## 2020-06-13 NOTE — Telephone Encounter (Addendum)
   Primary Cardiologist: Minus Breeding, MD  Chart reviewed as part of pre-operative protocol coverage. Patient has history of mild mitral regurgitation, PAD, HTN, HLD, normal EF 04/2020. He was last seen by Almyra Deforest PA-C 06/01/20.  IF SIMPLE EXTRACTION/CLEANINGS: Simple dental extractions are considered low risk procedures per guidelines and generally do not require any specific cardiac clearance. It is also generally accepted that for simple extractions and dental cleanings, there is no need to interrupt blood thinner therapy. Of note, per phone note 06/06/20, the provider advised the patient he may actually stop Plavix after finishing his current bottle so may no longer be on this medicine by the time he has procedure depending on the date.  SBE prophylaxis is not required for the patient based on available records.  I will route this recommendation to the requesting party via Epic fax function and remove from pre-op pool. Did fax a second copy to include h/o PAD in the medical history above.  Please call with questions.  Charlie Pitter, PA-C 06/13/2020, 10:05 AM

## 2020-06-13 NOTE — Telephone Encounter (Signed)
   [facility] Pre-operative Risk Assessment     Request for surgical clearance:  1. What type of surgery is being performed? Tooth extraction (called to clarify, 1 tooth)   2. When is this surgery scheduled? TBD   3. What type of clearance is required (medical clearance vs. Pharmacy clearance to hold med vs. Both)? both  4. Are there any medications that need to be held prior to surgery and how long? Plavix   5. Practice name and name of physician performing surgery? [facility] Dr. Tamala Julian  6. What is the office phone number? [phone]   7.   What is the office fax number? [phone]  8.   Anesthesia type (None, local, MAC, general) ? local   Danny Hudson Danny Hudson 06/13/2020, 8:21 AM  _________________________________________________________________

## 2020-07-13 ENCOUNTER — Ambulatory Visit: Payer: Medicare HMO | Admitting: Medical

## 2020-07-19 ENCOUNTER — Other Ambulatory Visit: Payer: Self-pay | Admitting: Cardiology

## 2020-07-19 ENCOUNTER — Telehealth: Payer: Self-pay | Admitting: Cardiovascular Disease

## 2020-07-19 NOTE — Telephone Encounter (Signed)
Pt's daughter called and said that the medication furosemide (LASIX) 40 MG tablet is no longer needed because he no longer has swelling in his legs. Swelling has been gone for about a month. She wants to know if he still should take it on as needed basis or just stop altogether. Please call to discuss

## 2020-07-21 NOTE — Telephone Encounter (Signed)
OK to take lasix PRN swelling  JJB

## 2020-07-23 NOTE — Telephone Encounter (Signed)
Pt daughter notified verbalized understanding

## 2020-08-02 DIAGNOSIS — R0902 Hypoxemia: Secondary | ICD-10-CM | POA: Insufficient documentation

## 2020-08-02 NOTE — Progress Notes (Signed)
Cardiology Office Note   Date:  08/03/2020   ID:  [name], DOB [birth year], MRN [id number]  PCP:  Jani Gravel, MD  Cardiologist:   Minus Breeding, MD Referring:  Jani Gravel, MD  Chief Complaint  Patient presents with  . Shortness of Breath      History of Present Illness: Danny Hudson is a 86 y.o. male who presents for Jani Gravel, MD for evaluation of mitral regurgitation. I saw him previously in 2016 for evaluation of a murmur.   He had mild MR.   He was treated by Dr. Gwenlyn Found for PVD with stenting.     Since his last been seen he has been doing well.  He walks in the neighborhood and in his house for exercise.  He might get dyspneic climbing the stairs but he is also limited by some knee and right hip pain.  He recovers quickly with his shortness of breath.  He is not having any PND or orthopnea.  He is not having any palpitations, presyncope or syncope.  He has no chest pressure, neck or arm discomfort.  He did have some claudication but this seems to be improved.    Past Medical History:  Diagnosis Date  . ALLERGIC RHINITIS 01/21/2007   Qualifier: Diagnosis of  By: Larose Kells MD, [place] Gout of ankle 03/21/2015  . HEMORRHOIDS, INTERNAL W/O COMPLICATION 5/42/7062   Qualifier: Diagnosis of  By: Larose Kells MD, [place] HYPERTENSION 01/21/2007   Qualifier: Diagnosis of  By: Larose Kells MD, [place]     Past Surgical History:  Procedure Laterality Date  . ABDOMINAL AORTOGRAM W/LOWER EXTREMITY Bilateral 08/15/2019   Procedure: ABDOMINAL AORTOGRAM W/LOWER EXTREMITY;  Surgeon: Lorretta Harp, MD;  Location: [place] [facility];  Service: Cardiovascular;  Laterality: Bilateral;  . ABDOMINAL AORTOGRAM W/LOWER EXTREMITY N/A 11/14/2019   Procedure: ABDOMINAL AORTOGRAM W/LOWER EXTREMITY;  Surgeon: Lorretta Harp, MD;  Location: [place] [facility];  Service: Cardiovascular;  Laterality: N/A;  . CATARACT EXTRACTION    . PERIPHERAL VASCULAR INTERVENTION Right 11/14/2019   Procedure: PERIPHERAL VASCULAR  INTERVENTION;  Surgeon: Lorretta Harp, MD;  Location: [place] [facility];  Service: Cardiovascular;  Laterality: Right;     Current Outpatient Medications  Medication Sig Dispense Refill  . amLODipine-valsartan (EXFORGE) 5-160 MG tablet Take 1 tablet by mouth daily. 90 tablet 1  . aspirin EC 81 MG tablet Take 81 mg by mouth at bedtime.    Marland Kitchen atorvastatin (LIPITOR) 80 MG tablet TAKE 1 TABLET DAILY AT 6 P.M. 90 tablet 3  . augmented betamethasone dipropionate (DIPROLENE-AF) 0.05 % ointment Apply topically.    . Cholecalciferol (VITAMIN D3) 50 MCG (2000 UT) capsule Take 2,000 Units by mouth daily.     Marland Kitchen triamcinolone cream (KENALOG) 0.1 % SMARTSIG:1 Application Topical 2-3 Times Daily     No current facility-administered medications for this visit.    Allergies:   Patient has no known allergies.    ROS:  Please see the history of present illness.   Otherwise, review of systems are positive for none.   All other systems are reviewed and negative.    PHYSICAL EXAM: VS:  BP (!) 120/56   Pulse 63   Ht 5\' 2"  (1.575 m)   Wt 133 lb (60.3 kg)   BMI 24.33 kg/m  , BMI Body mass index is 24.33 kg/m. GENERAL:  Well appearing NECK:  No jugular venous distention, waveform within normal limits,  carotid upstroke brisk and symmetric, no bruits, no thyromegaly LUNGS:   Bilateral basilar diffuse crackles without wheezing CHEST:  Unremarkable HEART:  PMI not displaced or sustained,S1 and S2 within normal limits, no S3, no S4, no clicks, no rubs, 2 out of 6 apical systolic murmur nonradiating, no diastolic murmurs ABD:  Flat, positive bowel sounds normal in frequency in pitch, no bruits, no rebound, no guarding, no midline pulsatile mass, no hepatomegaly, no splenomegaly EXT:  2 plus pulses upper, mildly decreased dorsalis pedis posterior tibialis , no edema, no cyanosis no clubbing    EKG:  EKG is  ordered today. The ekg ordered today demonstrates sinus rhythm, rate 63, right bundle branch  block, no acute ST-T wave changes.  PACs  Recent Labs: 11/15/2019: Hemoglobin 12.0; Platelets 170 05/18/2020: BUN 16; Creatinine, Ser 1.02; Potassium 4.2; Sodium 141    Lipid Panel    Component Value Date/Time   CHOL 183 01/05/2007 0814   TRIG 130 01/05/2007 0814   HDL 37.4 (L) 01/05/2007 0814   CHOLHDL 4.9 CALC 01/05/2007 0814   VLDL 26 01/05/2007 0814   LDLCALC 120 (H) 01/05/2007 0814      Wt Readings from Last 3 Encounters:  08/03/20 133 lb (60.3 kg)  06/01/20 130 lb 3.2 oz (59.1 kg)  05/04/20 134 lb 6.4 oz (61 kg)      Other studies Reviewed: Additional studies/ records that were reviewed today include: PV results Review of the above records demonstrates:  Please see elsewhere in the note.     ASSESSMENT AND PLAN:  MR:  This was mild on most recent echo.  He also has some mild aortic insufficiency.  I will follow this clinically.  HTN:  The blood pressure is at target.  No change in therapy.   HYPOXEMIA:  He had a normal echo when I saw him in Sept. he does have some mild dyspnea.  He had decreased oxygen saturations and some crackles on his exam which seem to be chronic.  However, he had an unremarkable chest x-ray and BNP was not elevated.  No change in therapy.  EDEMA: He has had some mild lower extremity swelling and has had to take as needed Lasix.  I discussed this with his daughter and no change in therapy and he can continue to still take this as needed.    Current medicines are reviewed at length with the patient today.  The patient does not have concerns regarding medicines.  The following changes have been made:  no change  Labs/ tests ordered today include:   Orders Placed This Encounter  Procedures  . EKG 12-Lead     Disposition:   FU with me in one year.      Signed, Minus Breeding, MD  08/03/2020 4:40 PM    [facility]

## 2020-08-03 ENCOUNTER — Ambulatory Visit (INDEPENDENT_AMBULATORY_CARE_PROVIDER_SITE_OTHER): Payer: Medicare HMO | Admitting: Cardiology

## 2020-08-03 ENCOUNTER — Encounter: Payer: Self-pay | Admitting: Cardiology

## 2020-08-03 ENCOUNTER — Other Ambulatory Visit: Payer: Self-pay

## 2020-08-03 VITALS — BP 120/56 | HR 63 | Ht 62.0 in | Wt 133.0 lb

## 2020-08-03 DIAGNOSIS — R0902 Hypoxemia: Secondary | ICD-10-CM | POA: Diagnosis not present

## 2020-08-03 DIAGNOSIS — I340 Nonrheumatic mitral (valve) insufficiency: Secondary | ICD-10-CM

## 2020-08-03 DIAGNOSIS — I10 Essential (primary) hypertension: Secondary | ICD-10-CM | POA: Diagnosis not present

## 2020-08-03 DIAGNOSIS — I739 Peripheral vascular disease, unspecified: Secondary | ICD-10-CM

## 2020-08-03 NOTE — Patient Instructions (Signed)
Medication Instructions:  No changes *If you need a refill on your cardiac medications before your next appointment, please call your pharmacy*  Lab Work: None ordered this visit.  Testing/Procedures: None ordered this visit  Follow-Up: At [facility], you and your health needs are our priority.  As part of our continuing mission to provide you with exceptional heart care, we have created designated Provider Care Teams.  These Care Teams include your primary Cardiologist (physician) and Advanced Practice Providers (APPs -  Physician Assistants and Nurse Practitioners) who all work together to provide you with the care you need, when you need it.  We recommend signing up for the patient portal called "MyChart".  Sign up information is provided on this After Visit Summary.  MyChart is used to connect with patients for Virtual Visits (Telemedicine).  Patients are able to view lab/test results, encounter notes, upcoming appointments, etc.  Non-urgent messages can be sent to your provider as well.   To learn more about what you can do with MyChart, go to [email or web address].    Your next appointment:   12 month(s)  You will receive a reminder letter in the mail two months in advance. If you don't receive a letter, please call our office to schedule the follow-up appointment.  The format for your next appointment:   In Person  Provider:   Minus Breeding, MD

## 2020-08-07 ENCOUNTER — Ambulatory Visit: Payer: Medicare HMO | Admitting: Cardiovascular Disease

## 2020-09-04 IMAGING — CR DG CHEST 2V
3 series · 3 of 3 positions shown · non-contrast
Comparison: None.

CLINICAL DATA: Decreased O2 sats

EXAM:
CHEST - 2 VIEW

[w chest pa]
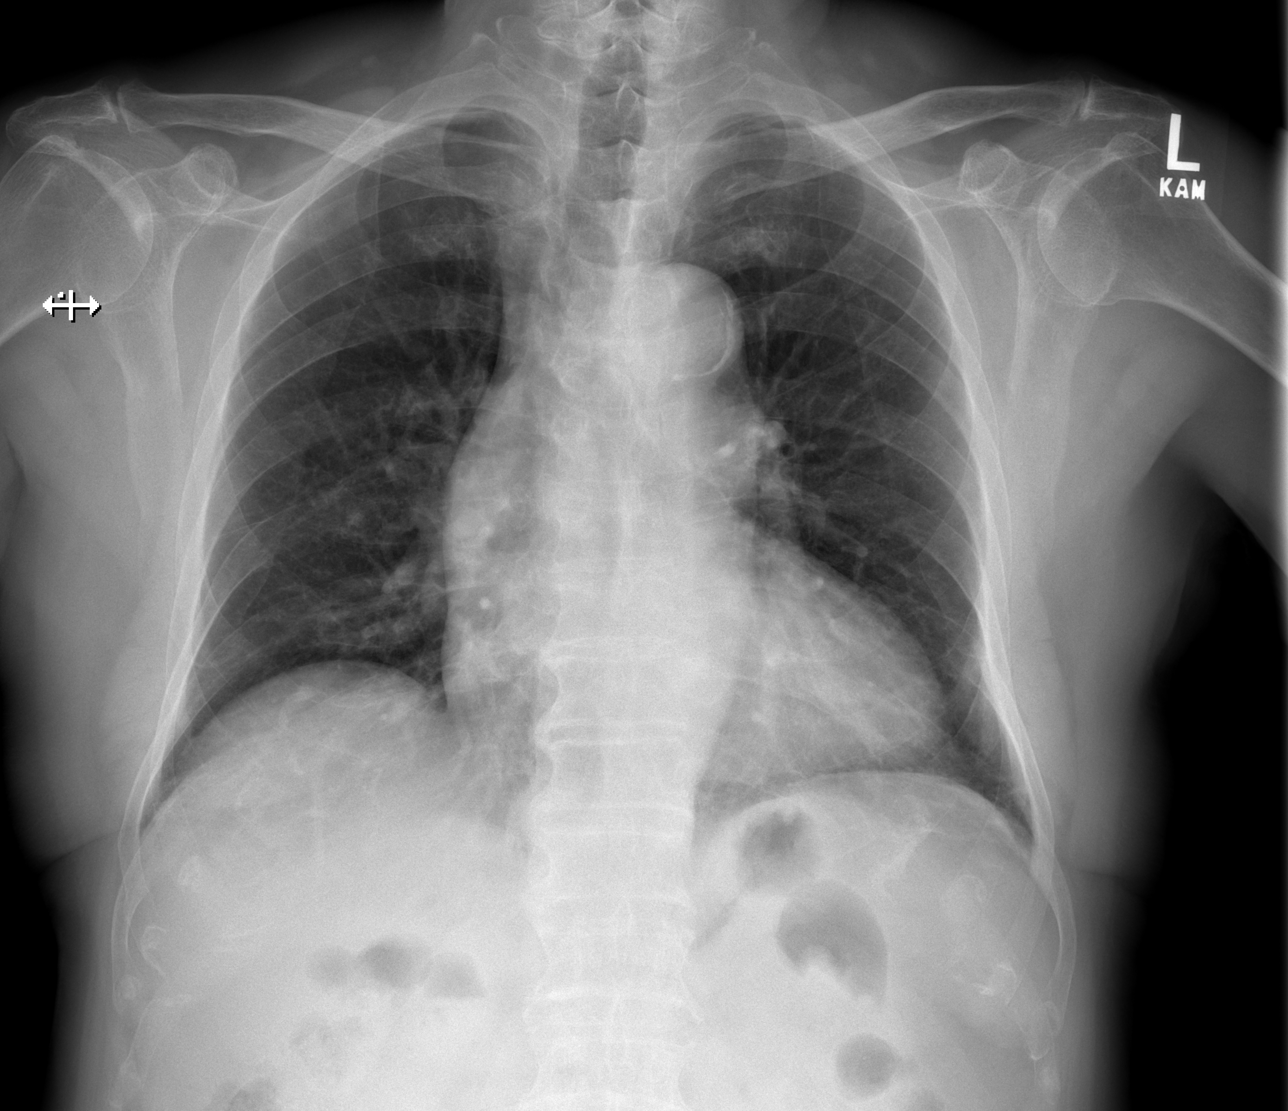

[w chest lat (1 of 2)]
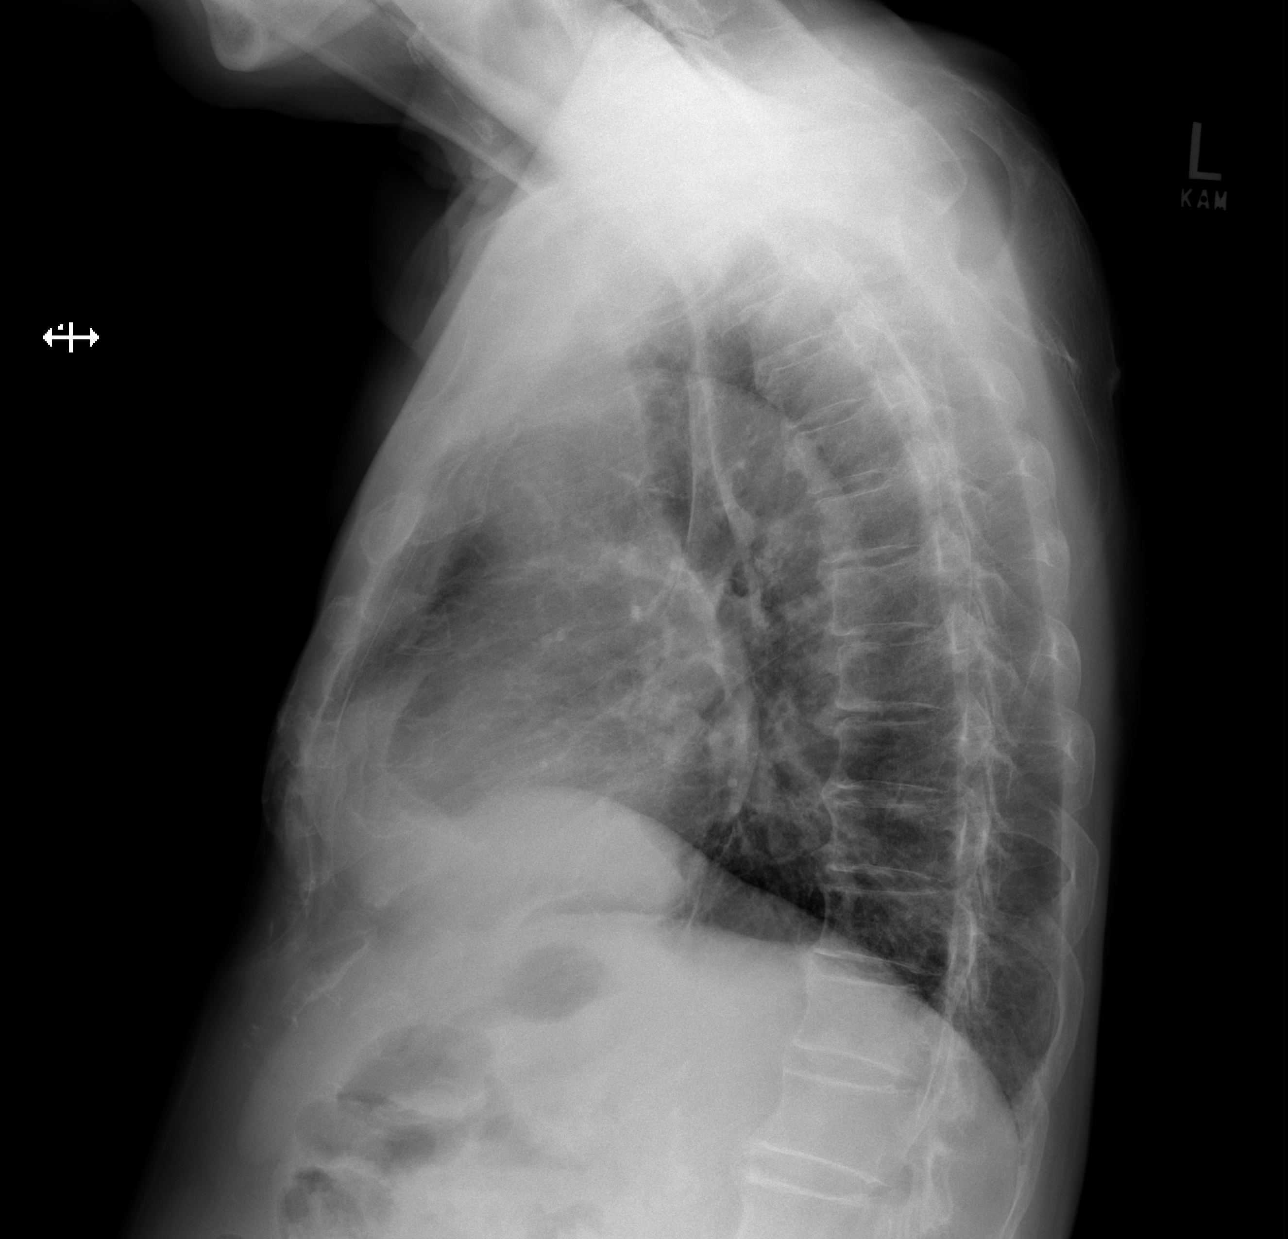

[w chest lat (2 of 2)]
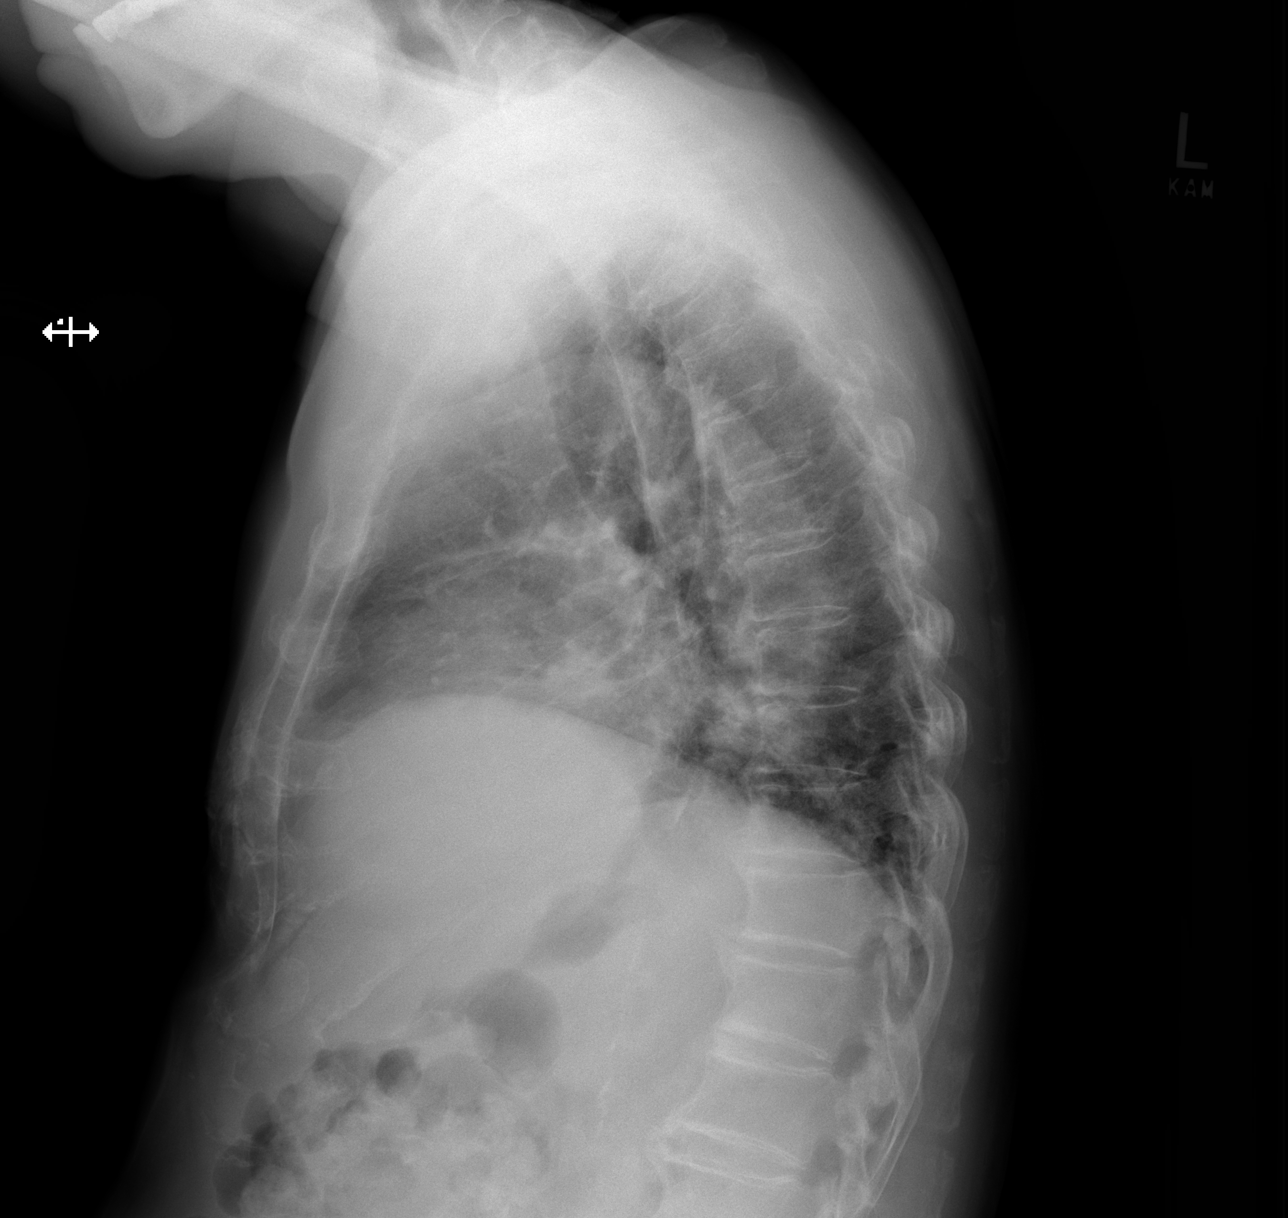

[3 of 3 positions shown; findings below may reference images not displayed]

FINDINGS: Cardiomegaly. No confluent opacities, effusions or edema. No acute
bony abnormality.
IMPRESSION: Mild cardiomegaly.  No active disease.

## 2020-11-14 ENCOUNTER — Other Ambulatory Visit: Payer: Self-pay | Admitting: Cardiovascular Disease

## 2020-12-07 ENCOUNTER — Other Ambulatory Visit: Payer: Self-pay | Admitting: Cardiovascular Disease

## 2020-12-07 ENCOUNTER — Ambulatory Visit (HOSPITAL_BASED_OUTPATIENT_CLINIC_OR_DEPARTMENT_OTHER)
Admission: RE | Admit: 2020-12-07 | Discharge: 2020-12-07 | Disposition: A | Discharge status: Home / Self Care | Payer: Medicare HMO | Source: Ambulatory Visit | Attending: Cardiology | Admitting: Cardiology

## 2020-12-07 ENCOUNTER — Other Ambulatory Visit: Payer: Self-pay

## 2020-12-07 ENCOUNTER — Ambulatory Visit (HOSPITAL_COMMUNITY)
Admission: RE | Admit: 2020-12-07 | Discharge: 2020-12-07 | Disposition: A | Discharge status: Home / Self Care | Payer: Medicare HMO | Source: Ambulatory Visit | Attending: Cardiology | Admitting: Cardiology

## 2020-12-07 DIAGNOSIS — I739 Peripheral vascular disease, unspecified: Secondary | ICD-10-CM

## 2020-12-07 DIAGNOSIS — Z95828 Presence of other vascular implants and grafts: Secondary | ICD-10-CM | POA: Diagnosis not present

## 2021-07-15 ENCOUNTER — Other Ambulatory Visit: Payer: Self-pay | Admitting: Cardiology

## 2021-10-25 ENCOUNTER — Telehealth: Payer: Self-pay | Admitting: Cardiology

## 2021-10-25 MED ORDER — ATORVASTATIN CALCIUM 80 MG PO TABS: 80.0000 mg | ORAL_TABLET | Freq: Every day | ORAL | 0 refills | Status: AC

## 2021-10-25 MED ORDER — AMLODIPINE BESYLATE-VALSARTAN 5-160 MG PO TABS: 1.0000 | ORAL_TABLET | Freq: Every day | ORAL | 0 refills | Status: DC

## 2021-10-25 NOTE — Telephone Encounter (Signed)
Refills has been sent to the pharmacy. 

## 2021-10-25 NOTE — Telephone Encounter (Signed)
°*  STAT* If patient is at the pharmacy, call can be transferred to refill team.   1. Which medications need to be refilled? (please list name of each medication and dose if known)  atorvastatin (LIPITOR) 80 MG tablet amLODipine-valsartan (EXFORGE) 5-160 MG tablet  2. Which pharmacy/location (including street and city if local pharmacy) is medication to be sent to? [facility]   3. Do they need a 30 day or 90 day supply? 90 day

## 2021-12-11 ENCOUNTER — Encounter (HOSPITAL_COMMUNITY): Payer: Medicare HMO

## 2021-12-31 ENCOUNTER — Other Ambulatory Visit (HOSPITAL_COMMUNITY): Payer: Self-pay | Admitting: Cardiovascular Disease

## 2021-12-31 ENCOUNTER — Other Ambulatory Visit: Payer: Self-pay | Admitting: Cardiology

## 2021-12-31 DIAGNOSIS — Z95828 Presence of other vascular implants and grafts: Secondary | ICD-10-CM

## 2021-12-31 DIAGNOSIS — I739 Peripheral vascular disease, unspecified: Secondary | ICD-10-CM

## 2022-01-06 ENCOUNTER — Ambulatory Visit (HOSPITAL_BASED_OUTPATIENT_CLINIC_OR_DEPARTMENT_OTHER)
Admission: RE | Admit: 2022-01-06 | Discharge: 2022-01-06 | Disposition: A | Discharge status: Home / Self Care | Payer: Medicare Other | Source: Ambulatory Visit | Attending: Cardiology | Admitting: Cardiology

## 2022-01-06 ENCOUNTER — Ambulatory Visit (HOSPITAL_COMMUNITY)
Admission: RE | Admit: 2022-01-06 | Discharge: 2022-01-06 | Disposition: A | Discharge status: Home / Self Care | Payer: Medicare Other | Source: Ambulatory Visit | Attending: Cardiology | Admitting: Cardiology

## 2022-01-06 DIAGNOSIS — I739 Peripheral vascular disease, unspecified: Secondary | ICD-10-CM | POA: Insufficient documentation

## 2022-01-06 DIAGNOSIS — Z95828 Presence of other vascular implants and grafts: Secondary | ICD-10-CM | POA: Insufficient documentation

## 2022-01-09 ENCOUNTER — Encounter: Payer: Self-pay | Admitting: *Deleted

## 2022-02-23 ENCOUNTER — Observation Stay (HOSPITAL_COMMUNITY)
Admission: EM | Admit: 2022-02-23 | Discharge: 2022-02-24 | Disposition: A | Discharge status: Home / Self Care | Payer: Medicare Other | Attending: Family Medicine | Admitting: Family Medicine

## 2022-02-23 ENCOUNTER — Encounter (HOSPITAL_COMMUNITY): Payer: Self-pay | Admitting: Emergency Medicine

## 2022-02-23 ENCOUNTER — Other Ambulatory Visit: Payer: Self-pay

## 2022-02-23 ENCOUNTER — Emergency Department (HOSPITAL_COMMUNITY): Payer: Medicare Other

## 2022-02-23 DIAGNOSIS — I10 Essential (primary) hypertension: Secondary | ICD-10-CM | POA: Diagnosis not present

## 2022-02-23 DIAGNOSIS — R079 Chest pain, unspecified: Secondary | ICD-10-CM

## 2022-02-23 DIAGNOSIS — Z95820 Peripheral vascular angioplasty status with implants and grafts: Secondary | ICD-10-CM | POA: Insufficient documentation

## 2022-02-23 DIAGNOSIS — Z7982 Long term (current) use of aspirin: Secondary | ICD-10-CM | POA: Diagnosis not present

## 2022-02-23 DIAGNOSIS — Z79899 Other long term (current) drug therapy: Secondary | ICD-10-CM | POA: Diagnosis not present

## 2022-02-23 DIAGNOSIS — Z87891 Personal history of nicotine dependence: Secondary | ICD-10-CM | POA: Insufficient documentation

## 2022-02-23 DIAGNOSIS — J189 Pneumonia, unspecified organism: Secondary | ICD-10-CM | POA: Diagnosis not present

## 2022-02-23 DIAGNOSIS — R0789 Other chest pain: Secondary | ICD-10-CM | POA: Diagnosis present

## 2022-02-23 LAB — BASIC METABOLIC PANEL
Anion gap: 8 (ref 5–15)
BUN: 17 mg/dL (ref 8–23)
CO2: 22 mmol/L (ref 22–32)
Calcium: 8.5 mg/dL — ABNORMAL LOW (ref 8.9–10.3)
Chloride: 105 mmol/L (ref 98–111)
Creatinine, Ser: 0.9 mg/dL (ref 0.61–1.24)
GFR, Estimated: >60 mL/min (ref >60)
Glucose, Bld: 112 mg/dL — ABNORMAL HIGH (ref 70–99)
Potassium: 3.7 mmol/L (ref 3.5–5.1)
Sodium: 135 mmol/L (ref 135–145)

## 2022-02-23 LAB — CBC
HCT: 38.8 % — ABNORMAL LOW (ref 39.0–52.0)
Hemoglobin: 12.8 g/dL — ABNORMAL LOW (ref 13.0–17.0)
MCH: 30 pg (ref 26.0–34.0)
MCHC: 33 g/dL (ref 30.0–36.0)
MCV: 91.1 fL (ref 80.0–100.0)
Platelets: 176 10*3/uL (ref 150–400)
RBC: 4.26 MIL/uL (ref 4.22–5.81)
RDW: 13 % (ref 11.5–15.5)
WBC: 18.8 10*3/uL — ABNORMAL HIGH (ref 4.0–10.5)
nRBC: 0 % (ref 0.0–0.2)

## 2022-02-23 LAB — TROPONIN I (HIGH SENSITIVITY)
Troponin I (High Sensitivity): 5 ng/L (ref <18)
Troponin I (High Sensitivity): 7 ng/L (ref <18)

## 2022-02-23 LAB — D-DIMER, QUANTITATIVE: D-Dimer, Quant: 0.74 ug/mL-FEU — ABNORMAL HIGH (ref 0.00–0.50)

## 2022-02-23 MED ORDER — ATORVASTATIN CALCIUM 80 MG PO TABS
80.0000 mg | ORAL_TABLET | Freq: Every day | ORAL | Status: DC
  Administered 2022-02-23 – 2022-02-24 (×2): 80 mg via ORAL
  Filled 2022-02-23 (×2): qty 1

## 2022-02-23 MED ORDER — AMLODIPINE BESYLATE-VALSARTAN 5-160 MG PO TABS
1.0000 | ORAL_TABLET | Freq: Every day | ORAL | Status: DC
Start: 2022-02-24 — End: 2022-02-23

## 2022-02-23 MED ORDER — ENOXAPARIN SODIUM 40 MG/0.4ML IJ SOSY: 40.0000 mg | PREFILLED_SYRINGE | INTRAMUSCULAR | Status: DC

## 2022-02-23 MED ORDER — IRBESARTAN 150 MG PO TABS
150.0000 mg | ORAL_TABLET | Freq: Every day | ORAL | Status: DC
  Administered 2022-02-24: 150 mg via ORAL
  Filled 2022-02-23: qty 1

## 2022-02-23 MED ORDER — ASPIRIN 81 MG PO TBEC: 81.0000 mg | DELAYED_RELEASE_TABLET | Freq: Every day | ORAL | Status: DC

## 2022-02-23 MED ORDER — ACETAMINOPHEN 325 MG PO TABS: 650.0000 mg | ORAL_TABLET | Freq: Four times a day (QID) | ORAL | Status: DC | PRN

## 2022-02-23 MED ORDER — AMLODIPINE BESYLATE 5 MG PO TABS
5.0000 mg | ORAL_TABLET | Freq: Every day | ORAL | Status: DC
  Administered 2022-02-24: 5 mg via ORAL
  Filled 2022-02-23: qty 1

## 2022-02-23 MED ORDER — ACETAMINOPHEN 650 MG RE SUPP: 650.0000 mg | Freq: Four times a day (QID) | RECTAL | Status: DC | PRN

## 2022-02-23 NOTE — ED Triage Notes (Addendum)
EMS stated chest pain and SOB but dizziness has been going on for months.  CBG 138 20 g left arm ASA '324mg'$ 

## 2022-02-23 NOTE — ED Notes (Signed)
Patient ambulated to the bathroom.

## 2022-02-23 NOTE — H&P (Signed)
Hospital Admission History and Physical Service Pager: [phone]  Patient name: Danny Hudson Medical record number: [id number] Date of Birth: 1928-03-03 Age: 88 y.o. Gender: male  Primary Care Provider: Jilda Panda, MD Consultants: none Code Status: FULL Preferred Emergency Contact:  Contact Information     Name Relation Home Work [place] Daughter [phone]          Chief Complaint: chest pain  Assessment and Plan: Danny Hudson is a 88 y.o. male presenting with noncardiac chest pain. Differential for this patient's presentation of this includes ACS, PE, MSK/costochondritis, viral pleurisy, pneumonia.  * Non-cardiac chest pain Chest pain sounds more pleuritic, no anginal features.  Low suspicion for ACS with normal troponin and absence of ischemic changes on EKG.  Has leukocytosis but no other signs to suggest pneumonia.  Also consider PE given elevated D-dimer, reported history of blood clot, and pleuritic nature of pain though not tachycardic or hypoxemic.  Will admit for observation, possible discharge tomorrow if work-up unrevealing. - admit to [place], [place], [place] attending - f/u CTPA ordered in the ED to rule out PE given elevated D-dimer - consider formal cardiology consult if pain worsening   Other problems chronic and stable: HTN - resume amlodipine, ARB PAD - resume statin, ASA   FEN/GI: Regular diet VTE Prophylaxis: Low molecular weight heparin start tomorrow  Disposition: Possible discharge home tomorrow 6/12  History of Present Illness:  Danny Hudson is a 88 y.o. male presenting with chest pain. Spouse and daughter at bedside.  Daughter translating for patient.  Patient started having chest pain earlier this morning while in the car on the way to church.  Pain is not worsened with exertion, not relieved by rest.  Described as a sharp pain which is worse with taking deep breaths.  Per ED provider note, chest pain lasted about 1 to 2  hours; however, patient is still having some chest discomfort currently.  Denies shortness of breath.  No recent long travel.  Patient was given a 224 mg ASA load by EMS.  In the ED, labs obtained overall unrevealing except for leukocytosis with WBC 18.8.  Troponins trended 5, 7.  EKG without ischemic changes.  CXR obtained overall unremarkable, revealed bilateral infiltrate/atelectasis.  Noted to have a mildly elevated D-dimer 0.74, CTPA is ordered.  Review Of Systems: Per HPI with the following additions:  Review of Systems  Constitutional:  Negative for chills and fever.  HENT:  Negative for congestion.   Respiratory:  Negative for cough.   Cardiovascular:  Positive for chest pain. Negative for leg swelling.  Gastrointestinal:  Negative for abdominal pain, diarrhea, nausea and vomiting.     Pertinent Past Medical History: Mild mitral regurg PAD s/p stenting ?  History of blood clot Remainder reviewed in history tab.   Pertinent Past Surgical History:    Remainder reviewed in history tab.  Pertinent Social History: Tobacco use: Yes/No/Former smoker about 2 packs/day for about 4 years in his 1s Alcohol use: None current Other Substance use: None Lives with spouse, independent at home and does not require walker or cane  Pertinent Family History:   Remainder reviewed in history tab.   Important Outpatient Medications:  Remainder reviewed in medication history.   Objective: BP 135/63   Pulse 82   Temp 98.8 F (37.1 C) (Oral)   Resp (!) 24   SpO2 97%  Exam: General: Alert, elderly male, hard of hearing, NAD Neck: Supple Cardiovascular: RRR, no  murmurs Respiratory: Clear to auscultation bilaterally, breathing comfortably on room air Gastrointestinal: Soft, nontender MSK: No edema Derm: Warm, dry Neuro: Alert and interactive Psych: Affect appropriate  Labs:  CBC BMET  Recent Labs  Lab 02/23/22 1139  WBC 18.8*  HGB 12.8*  HCT 38.8*  PLT 176   Recent Labs   Lab 02/23/22 1139  NA 135  K 3.7  CL 105  CO2 22  BUN 17  CREATININE 0.90  GLUCOSE 112*  CALCIUM 8.5*    Pertinent additional labs .  EKG: My own interpretation (not copied from electronic read) NSR, PR prolongation, RBBB   Imaging Studies Performed:  Imaging Study (ie. Chest x-ray) Impression from Radiologist: Low volumes with bibasilar atelectasis or infiltrates.   My Interpretation: Favor atelectasis   Danny Button, MD 02/23/2022, 8:16 PM PGY-2, [facility] Intern pager: [phone], text pages welcome Secure chat group [place]

## 2022-02-23 NOTE — ED Triage Notes (Signed)
Pt/translator stated, daughter, they arrived at church and started having chest pain  and SOB , denies any other symptoms

## 2022-02-23 NOTE — ED Provider Notes (Signed)
[facility] EMERGENCY DEPARTMENT Provider Note   CSN: [id number] Arrival date & time: 02/23/22  1106     History  Chief Complaint  Patient presents with   Chest Pain   Shortness of Breath   Dizziness    Danny Hudson is a 88 y.o. male.  88 year old male with prior medical history detailed below presents for evaluation.  Patient is very hard of hearing.  Patient speaks [place].  Interpretation assistance obtained from the patient's family who spoke to the patient in [place] and he read their lips.  Patient with acute onset of chest pain this a.m. around 10.  Patient does not typically complain of chest pain.  Chest pain apparently lasted 1 to 2 hours.  Patient appears to be comfortable now.  Patient apparently complained of associated shortness of breath.  Patient denies recent illness such as fever, other chest discomfort, cough, etc.  The history is provided by the patient and medical records.  Chest Pain Pain location:  Unable to specify Pain quality: aching   Pain radiates to:  Does not radiate Pain severity:  Unable to specify Onset quality:  Unable to specify Timing:  Unable to specify Associated symptoms: dizziness and shortness of breath   Shortness of Breath Associated symptoms: chest pain   Dizziness Associated symptoms: chest pain and shortness of breath        Home Medications Prior to Admission medications   Medication Sig Start Date End Date Taking? Authorizing Provider  amLODipine-valsartan (EXFORGE) 5-160 MG tablet TAKE 1 TABLET BY MOUTH DAILY 01/03/22   Minus Breeding, MD  aspirin EC 81 MG tablet Take 81 mg by mouth at bedtime.    [provider]  atorvastatin (LIPITOR) 80 MG tablet Take 1 tablet (80 mg total) by mouth daily. NEED OV. 10/25/21   Minus Breeding, MD  augmented betamethasone dipropionate (DIPROLENE-AF) 0.05 % ointment Apply topically. 05/28/20   [provider]  Cholecalciferol (VITAMIN D3) 50 MCG  (2000 UT) capsule Take 2,000 Units by mouth daily.     [provider]  triamcinolone cream (KENALOG) 0.1 % SMARTSIG:1 Application Topical 2-3 Times Daily 05/25/20   [provider]      Allergies    Patient has no known allergies.    Review of Systems   Review of Systems  Respiratory:  Positive for shortness of breath.   Cardiovascular:  Positive for chest pain.  Neurological:  Positive for dizziness.  All other systems reviewed and are negative.   Physical Exam Updated Vital Signs BP (!) 148/71   Pulse 83   Temp 98.8 F (37.1 C) (Oral)   Resp (!) 24   SpO2 96%  Physical Exam Vitals and nursing note reviewed.  Constitutional:      General: He is not in acute distress.    Appearance: Normal appearance. He is well-developed.  HENT:     Head: Normocephalic and atraumatic.  Eyes:     Conjunctiva/sclera: Conjunctivae normal.     Pupils: Pupils are equal, round, and reactive to light.  Cardiovascular:     Rate and Rhythm: Normal rate and regular rhythm.     Heart sounds: Normal heart sounds.  Pulmonary:     Effort: Pulmonary effort is normal. No respiratory distress.     Breath sounds: Normal breath sounds.  Abdominal:     General: There is no distension.     Palpations: Abdomen is soft.     Tenderness: There is no abdominal tenderness.  Musculoskeletal:  General: No deformity. Normal range of motion.     Cervical back: Normal range of motion and neck supple.  Skin:    General: Skin is warm and dry.  Neurological:     General: No focal deficit present.     Mental Status: He is alert and oriented to person, place, and time.     ED Results / Procedures / Treatments   Labs (all labs ordered are listed, but only abnormal results are displayed) Labs Reviewed  BASIC METABOLIC PANEL - Abnormal; Notable for the following components:      Result Value   Glucose, Bld 112 (*)    Calcium 8.5 (*)    All other components within normal limits  CBC -  Abnormal; Notable for the following components:   WBC 18.8 (*)    Hemoglobin 12.8 (*)    HCT 38.8 (*)    All other components within normal limits  D-DIMER, QUANTITATIVE - Abnormal; Notable for the following components:   D-Dimer, Quant 0.74 (*)    All other components within normal limits  TROPONIN I (HIGH SENSITIVITY)  TROPONIN I (HIGH SENSITIVITY)    EKG EKG Interpretation  Date/Time:  Sunday February 23 2022 11:17:15 EDT Ventricular Rate:  88 PR Interval:  240 QRS Duration: 148 QT Interval:  406 QTC Calculation: 491 R Axis:   23 Text Interpretation: Sinus rhythm with 1st degree A-V block Right bundle branch block Abnormal ECG When compared with ECG of 15-Nov-2019 03:14, PREVIOUS ECG IS PRESENT Confirmed by Dene Gentry [id number]) on 02/23/2022 3:13:40 PM  Radiology DG Chest 2 View  Result Date: 02/23/2022 CLINICAL DATA:  Reason for exam: Chest pain, SOB, Dizziness Per triage notes: Pt/translator stated, daughter, they arrived at church and started having chest pain and SOB , denies any other symptoms AND EMS stated chest pain and SOB EXAM: CHEST - 2 VIEW COMPARISON:  05/20/2019 FINDINGS: Relatively low lung volumes. New bibasilar atelectasis or infiltrate, left greater than right. No definite pleural effusion on the lateral radiograph. Heart size upper limits normal. Aortic Atherosclerosis (ICD10-170.0). No pneumothorax. Visualized bones unremarkable. IMPRESSION: Low volumes with bibasilar atelectasis or infiltrates. Aortic Atherosclerosis (ICD10-170.0). Electronically Signed   By: Lucrezia Europe M.D.   On: 02/23/2022 13:06    Procedures Procedures    Medications Ordered in ED Medications - No data to display  ED Course/ Medical Decision Making/ A&P                           Medical Decision Making Amount and/or Complexity of Data Reviewed Labs: ordered. Radiology: ordered.  Risk Decision regarding hospitalization.    Medical Screen Complete  This patient presented to the  ED with complaint of chest pain.  This complaint involves an extensive number of treatment options. The initial differential diagnosis includes, but is not limited to, ACS, metabolic abnormality, intrapulmonary pathology, etc.  This presentation is: Acute, Self-Limited, Previously Undiagnosed, Uncertain Prognosis, Complicated, Systemic Symptoms, and Threat to Life/Bodily Function  Patient is presenting with complaint of chest pain  Patient is a very poor historian secondary to both near deafness and language barrier.  Patient's initial EKG is without acute ischemic changes.  Initial troponin is 5.  Patient would benefit from admission and overnight observation.  Case discussed briefly with Dr. Margaretann Loveless, cardiology, who agrees with plan.  She request that the medicine team call for an official cardiology consult if required.  Medicine team is aware of case and will evaluate  for admission.  Additional history obtained:  Additional history obtained from [facility] External records from outside sources obtained and reviewed including prior ED visits and prior Inpatient records.    Lab Tests:  I ordered and personally interpreted labs.  The pertinent results include: CBC, BMP, troponin, D-dimer   Imaging Studies ordered:  I ordered imaging studies including chest x-ray, CT angio PE rule out I independently visualized and interpreted obtained imaging which showed NAD I agree with the radiologist interpretation.   Cardiac Monitoring:  The patient was maintained on a cardiac monitor.  I personally viewed and interpreted the cardiac monitor which showed an underlying rhythm of: NSR  Problem List / ED Course:  Chest pain   Reevaluation:  After the interventions noted above, I reevaluated the patient and found that they have: improved   Disposition:  After consideration of the diagnostic results and the patients response to treatment, I feel that the patent would benefit from  admission.          Final Clinical Impression(s) / ED Diagnoses Final diagnoses:  Chest pain, unspecified type    Rx / DC Orders ED Discharge Orders     None         Valarie Merino, MD 02/23/22 1740

## 2022-02-23 NOTE — ED Notes (Signed)
Dinner tray delivered.

## 2022-02-23 NOTE — Assessment & Plan Note (Addendum)
Pleuritic chest pain with unremarkable ACS work-up. CTPA showed cardiomegaly, CAD, bibasilar atelectasis or infiltrate/pneumonia. Leukocytosis has worsened 18.8>23.9 however is remained afebrile and with otherwise normal vital signs with exception of SPO2 low 90s on room air.  -

## 2022-02-24 ENCOUNTER — Observation Stay (HOSPITAL_COMMUNITY): Payer: Medicare Other

## 2022-02-24 ENCOUNTER — Other Ambulatory Visit (HOSPITAL_COMMUNITY): Payer: Self-pay

## 2022-02-24 LAB — CBC
HCT: 35.2 % — ABNORMAL LOW (ref 39.0–52.0)
Hemoglobin: 11.6 g/dL — ABNORMAL LOW (ref 13.0–17.0)
MCH: 29.9 pg (ref 26.0–34.0)
MCHC: 33 g/dL (ref 30.0–36.0)
MCV: 90.7 fL (ref 80.0–100.0)
Platelets: 159 10*3/uL (ref 150–400)
RBC: 3.88 MIL/uL — ABNORMAL LOW (ref 4.22–5.81)
RDW: 13.2 % (ref 11.5–15.5)
WBC: 23.9 10*3/uL — ABNORMAL HIGH (ref 4.0–10.5)
nRBC: 0 % (ref 0.0–0.2)

## 2022-02-24 LAB — COMPREHENSIVE METABOLIC PANEL
ALT: 21 U/L (ref 0–44)
AST: 20 U/L (ref 15–41)
Albumin: 3.1 g/dL — ABNORMAL LOW (ref 3.5–5.0)
Alkaline Phosphatase: 61 U/L (ref 38–126)
Anion gap: 8 (ref 5–15)
BUN: 19 mg/dL (ref 8–23)
CO2: 24 mmol/L (ref 22–32)
Calcium: 8.4 mg/dL — ABNORMAL LOW (ref 8.9–10.3)
Chloride: 103 mmol/L (ref 98–111)
Creatinine, Ser: 1.14 mg/dL (ref 0.61–1.24)
GFR, Estimated: >60 mL/min (ref >60)
Glucose, Bld: 119 mg/dL — ABNORMAL HIGH (ref 70–99)
Potassium: 3.8 mmol/L (ref 3.5–5.1)
Sodium: 135 mmol/L (ref 135–145)
Total Bilirubin: 1.4 mg/dL — ABNORMAL HIGH (ref 0.3–1.2)
Total Protein: 6.4 g/dL — ABNORMAL LOW (ref 6.5–8.1)

## 2022-02-24 MED ORDER — ENOXAPARIN SODIUM 30 MG/0.3ML IJ SOSY: 30.0000 mg | PREFILLED_SYRINGE | INTRAMUSCULAR | Status: DC

## 2022-02-24 MED ORDER — IOHEXOL 350 MG/ML SOLN
80.0000 mL | Freq: Once | INTRAVENOUS | Status: AC | PRN
  Administered 2022-02-24: 80 mL via INTRAVENOUS

## 2022-02-24 MED ORDER — AZITHROMYCIN 500 MG PO TABS
500.0000 mg | ORAL_TABLET | Freq: Every day | ORAL | 0 refills | Status: AC
  Filled 2022-02-24: qty 3, 3d supply, fill #0

## 2022-02-24 NOTE — Progress Notes (Signed)
FPTS Brief Progress Note  S:Went to patient bedside to see patient. Patient sleeping, did not disturb.   O: BP (!) 114/58   Pulse 77   Temp 98.8 F (37.1 C) (Oral)   Resp (!) 25   SpO2 91%     A/P: - Plans per day team - Orders reviewed. Labs for AM ordered, which was adjusted as needed.   Holley Bouche, MD 02/24/2022, 5:00 AM PGY-1, [facility] Night Resident  Please page [phone] with questions.

## 2022-02-24 NOTE — Discharge Summary (Signed)
[facility] Discharge Summary  Patient name: Danny Hudson Medical record number: [id number] Date of birth: [birth year] Age: 88 y.o. Gender: male Date of Admission: 02/23/2022  Date of Discharge: 02/24/2022 Admitting Physician: Zola Button, MD  Primary Care Provider: Jilda Panda, MD Consultants: None  Indication for Hospitalization: Chest pain with deep breaths  Discharge Diagnoses/Problem List:  Principal Problem:   Community acquired pneumonia  Disposition: Home  Discharge Condition: Stable  Discharge Exam:  Blood pressure 117/60, pulse 82, temperature 99.1 F (37.3 C), temperature source Oral, resp. rate (!) 29, SpO2 93 %.  General: Awake and alert, NAD, hard of hearing CV: RRR, no murmurs auscultated Respiratory: Crackles in the lower lung bases bilaterally, normal WOB, breathing comfortably on room air Extremities: Gait appropriate  Brief Hospital Course:  CORDARRELL SANE is a 88 y.o.male with a history of HTN, PAD, mitral regurgitation who was admitted to the [facility] Teaching Service at [facility] for pleuritic chest pain. His hospital course is detailed below:  Community-acquired pneumonia Patient presented with chest pain during deep breaths and leukocytosis of 18.8.  ACS ruled out but revealed elevated D-dimer to 0.74 during work-up.  CTPA showed no evidence of PE, however bibasilar atelectasis with infiltrates/pneumonia.  Repeat CBC showed leukocytosis of 23.9.  Chest pain resolved and patient remained afebrile throughout observation.  Opted to treat as community-acquired pneumonia with azithromycin 500 mg daily x3 days.  Other chronic conditions were medically managed with home medications and formulary alternatives as necessary (HTN, PAD)  PCP Follow-up Recommendations: CBC for resolved leukocytosis  Significant Procedures: None  Significant Labs and Imaging:  Recent Labs  Lab 02/23/22 1139 02/24/22 0615  WBC 18.8* 23.9*  HGB 12.8*  11.6*  HCT 38.8* 35.2*  PLT 176 159   Recent Labs  Lab 02/23/22 1139 02/24/22 0615  NA 135 135  K 3.7 3.8  CL 105 103  CO2 22 24  GLUCOSE 112* 119*  BUN 17 19  CREATININE 0.90 1.14  CALCIUM 8.5* 8.4*  ALKPHOS  --  61  AST  --  20  ALT  --  21  ALBUMIN  --  3.1*   Results/Tests Pending at Time of Discharge: None  Discharge Medications:  Allergies as of 02/24/2022   No Known Allergies      Medication List     TAKE these medications    amLODipine-valsartan 5-160 MG tablet Commonly known as: EXFORGE TAKE 1 TABLET BY MOUTH DAILY   aspirin EC 81 MG tablet Take 81 mg by mouth at bedtime.   atorvastatin 80 MG tablet Commonly known as: LIPITOR Take 1 tablet (80 mg total) by mouth daily. NEED OV. What changed:  when to take this additional instructions   azithromycin 500 MG tablet Commonly known as: Zithromax Take 1 tablet (500 mg total) by mouth daily for 3 days. Take 1 tablet daily for 3 days.   vitamin C 1000 MG tablet Take 500 mg by mouth See admin instructions. 500 mg in the morning  500 mg at noon   Vitamin D3 50 MCG (2000 UT) capsule Take 2,000 Units by mouth daily at 12 noon.        Discharge Instructions: Please refer to Patient Instructions section of EMR for full details.  Patient was counseled important signs and symptoms that should prompt return to medical care, changes in medications, dietary instructions, activity restrictions, and follow up appointments.   Follow-Up Appointments: No future appointments.  Wells Guiles, DO 02/24/2022, 1:45 PM PGY-1,  [place]

## 2022-02-24 NOTE — ED Notes (Signed)
Patient back from CT.

## 2022-02-24 NOTE — ED Notes (Signed)
Patient transported to CT 

## 2022-02-24 NOTE — ED Notes (Signed)
Family at bedside. 

## 2022-02-24 NOTE — Discharge Instructions (Addendum)
Dear Danny Hudson,   Thank you for letting us participate in your care! In this section, you will find a brief hospital admission summary of why you were admitted to the hospital, what happened during your admission, your diagnosis/diagnoses, and recommended follow up.  You were admitted because you were experiencing chest pain correlated with difficulty breathing.  Your testing revealed potential developing pneumonia. We have decided to give you antibiotics to treat this. Please take this Azithromycin once daily for the next 3 days and follow up with your PCP in the outpatient setting.  Thank you for choosing [facility]! Take care and be well!  [facility]  [place], [place] [address] [phone]

## 2022-02-24 NOTE — ED Notes (Signed)
Patient ambulated to bathroom with minium assistance.

## 2022-02-24 NOTE — Hospital Course (Addendum)
Danny Hudson is a 88 y.o.male with a history of HTN, PAD, mitral regurgitation who was admitted to the [facility] Teaching Service at [facility] for pleuritic chest pain. His hospital course is detailed below:  Community-acquired pneumonia Patient presented with chest pain during deep breaths and leukocytosis of 18.8.  ACS ruled out but revealed elevated D-dimer to 0.74 during work-up.  CTPA showed no evidence of PE, however bibasilar atelectasis with infiltrates/pneumonia.  Repeat CBC showed leukocytosis of 23.9.  Chest pain resolved and patient remained afebrile throughout observation.  Opted to treat as community-acquired pneumonia with azithromycin 500 mg daily x3 days.  Other chronic conditions were medically managed with home medications and formulary alternatives as necessary (HTN, PAD)  PCP Follow-up Recommendations: CBC for resolved leukocytosis

## 2022-02-24 NOTE — ED Notes (Addendum)
MD paged to discuss CT results and WBC elevation. Pt still reporting chest pain.

## 2022-03-21 NOTE — Progress Notes (Unsigned)
Cardiology Office Note   Date:  03/21/2022   ID:  Danny Hudson, DOB Mar 14, 1928, MRN [id number]  PCP:  Jilda Panda, MD  Cardiologist:   Minus Breeding, MD Referring:  Jilda Panda, MD  No chief complaint on file.     History of Present Illness: Danny Hudson is a 88 y.o. male who presents for Jilda Panda, MD for evaluation of mitral regurgitation. I saw him previously in 2016 for evaluation of a murmur.   He had mild MR.   He was treated by Dr. Gwenlyn Found for PVD with stenting.   At the last visit I had him wear a monitor for palpitations.  This demonstrated atrial fib.  He presents to follow up on this.  ***    ***  *** Since his last been seen he has been doing well.  He walks in the neighborhood and in his house for exercise.  He might get dyspneic climbing the stairs but he is also limited by some knee and right hip pain.  He recovers quickly with his shortness of breath.  He is not having any PND or orthopnea.  He is not having any palpitations, presyncope or syncope.  He has no chest pressure, neck or arm discomfort.  He did have some claudication but this seems to be improved.    Past Medical History:  Diagnosis Date   ALLERGIC RHINITIS 01/21/2007   Qualifier: Diagnosis of  By: Larose Kells MD, [place].    Gout of ankle 03/21/2015   HEMORRHOIDS, INTERNAL W/O COMPLICATION 01/18/3975   Qualifier: Diagnosis of  By: Larose Kells MD, [place]    HYPERTENSION 01/21/2007   Qualifier: Diagnosis of  By: Larose Kells MD, [place]     Past Surgical History:  Procedure Laterality Date   ABDOMINAL AORTOGRAM W/LOWER EXTREMITY Bilateral 08/15/2019   Procedure: ABDOMINAL AORTOGRAM W/LOWER EXTREMITY;  Surgeon: Lorretta Harp, MD;  Location: [facility];  Service: Cardiovascular;  Laterality: Bilateral;   ABDOMINAL AORTOGRAM W/LOWER EXTREMITY N/A 11/14/2019   Procedure: ABDOMINAL AORTOGRAM W/LOWER EXTREMITY;  Surgeon: Lorretta Harp, MD;  Location: [facility];  Service: Cardiovascular;  Laterality: N/A;    CATARACT EXTRACTION     PERIPHERAL VASCULAR INTERVENTION Right 11/14/2019   Procedure: PERIPHERAL VASCULAR INTERVENTION;  Surgeon: Lorretta Harp, MD;  Location: [facility];  Service: Cardiovascular;  Laterality: Right;     Current Outpatient Medications  Medication Sig Dispense Refill   amLODipine-valsartan (EXFORGE) 5-160 MG tablet TAKE 1 TABLET BY MOUTH DAILY (Patient taking differently: Take 1 tablet by mouth daily.) 90 tablet 0   Ascorbic Acid (VITAMIN C) 1000 MG tablet Take 500 mg by mouth See admin instructions. 500 mg in the morning  500 mg at noon     aspirin EC 81 MG tablet Take 81 mg by mouth at bedtime.     atorvastatin (LIPITOR) 80 MG tablet Take 1 tablet (80 mg total) by mouth daily. NEED OV. (Patient taking differently: Take 80 mg by mouth every evening.) 90 tablet 0   Cholecalciferol (VITAMIN D3) 50 MCG (2000 UT) capsule Take 2,000 Units by mouth daily at 12 noon.     No current facility-administered medications for this visit.    Allergies:   Patient has no known allergies.    ROS:  Please see the history of present illness.   Otherwise, review of systems are positive for ***.   All other systems are reviewed and negative.    PHYSICAL EXAM: VS:  There were no vitals taken for this visit. , BMI There is no height or weight on file to calculate BMI. GENERAL:  Well appearing NECK:  No jugular venous distention, waveform within normal limits, carotid upstroke brisk and symmetric, no bruits, no thyromegaly LUNGS:  Clear to auscultation bilaterally CHEST:  Unremarkable HEART:  PMI not displaced or sustained,S1 and S2 within normal limits, no S3, no S4, no clicks, no rubs, *** murmurs ABD:  Flat, positive bowel sounds normal in frequency in pitch, no bruits, no rebound, no guarding, no midline pulsatile mass, no hepatomegaly, no splenomegaly EXT:  2 plus pulses throughout, no edema, no cyanosis no clubbing     ***GENERAL:  Well appearing NECK:  No jugular  venous distention, waveform within normal limits, carotid upstroke brisk and symmetric, no bruits, no thyromegaly LUNGS:   Bilateral basilar diffuse crackles without wheezing CHEST:  Unremarkable HEART:  PMI not displaced or sustained,S1 and S2 within normal limits, no S3, no S4, no clicks, no rubs, 2 out of 6 apical systolic murmur nonradiating, no diastolic murmurs ABD:  Flat, positive bowel sounds normal in frequency in pitch, no bruits, no rebound, no guarding, no midline pulsatile mass, no hepatomegaly, no splenomegaly EXT:  2 plus pulses upper, mildly decreased dorsalis pedis posterior tibialis , no edema, no cyanosis no clubbing    EKG:  EKG is *** ordered today. The ekg ordered today demonstrates sinus rhythm, rate ***, right bundle branch block, no acute ST-T wave changes.  PACs  Recent Labs: 02/24/2022: ALT 21; BUN 19; Creatinine, Ser 1.14; Hemoglobin 11.6; Platelets 159; Potassium 3.8; Sodium 135    Lipid Panel    Component Value Date/Time   CHOL 183 01/05/2007 0814   TRIG 130 01/05/2007 0814   HDL 37.4 (L) 01/05/2007 0814   CHOLHDL 4.9 CALC 01/05/2007 0814   VLDL 26 01/05/2007 0814   LDLCALC 120 (H) 01/05/2007 0814      Wt Readings from Last 3 Encounters:  08/03/20 133 lb (60.3 kg)  06/01/20 130 lb 3.2 oz (59.1 kg)  05/04/20 134 lb 6.4 oz (61 kg)      Other studies Reviewed: Additional studies/ records that were reviewed today include:  *** Review of the above records demonstrates:  Please see elsewhere in the note.     ASSESSMENT AND PLAN:  ATRIAL FIB:  ***  MR:   ***   This was mild on most recent echo.  He also has some mild aortic insufficiency.  I will follow this clinically.  HTN:  The blood pressure is ***  at target.  No change in therapy.   HYPOXEMIA:  ***  He had a normal echo when I saw him in Sept. he does have some mild dyspnea.  He had decreased oxygen saturations and some crackles on his exam which seem to be chronic.  However, he had an  unremarkable chest x-ray and BNP was not elevated.  No change in therapy.  EDEMA:  ***  He has had some mild lower extremity swelling and has had to take as needed Lasix.  I discussed this with his daughter and no change in therapy and he can continue to still take this as needed.     Current medicines are reviewed at length with the patient today.  The patient does not have concerns regarding medicines.  The following changes have been made:  ***  Labs/ tests ordered today include: ***  No orders of the defined types were placed in this encounter.  Disposition:   FU with ***   Signed, Minus Breeding, MD  03/21/2022 1:39 PM    [facility]

## 2022-03-22 DIAGNOSIS — I480 Paroxysmal atrial fibrillation: Secondary | ICD-10-CM | POA: Insufficient documentation

## 2022-03-24 ENCOUNTER — Ambulatory Visit: Payer: Medicare Other | Admitting: Cardiology

## 2022-03-24 ENCOUNTER — Encounter: Payer: Self-pay | Admitting: Cardiology

## 2022-03-24 VITALS — BP 130/60 | HR 76 | Ht 62.0 in | Wt 126.4 lb

## 2022-03-24 DIAGNOSIS — I340 Nonrheumatic mitral (valve) insufficiency: Secondary | ICD-10-CM

## 2022-03-24 DIAGNOSIS — R0902 Hypoxemia: Secondary | ICD-10-CM | POA: Diagnosis not present

## 2022-03-24 DIAGNOSIS — I10 Essential (primary) hypertension: Secondary | ICD-10-CM | POA: Diagnosis not present

## 2022-03-24 DIAGNOSIS — I480 Paroxysmal atrial fibrillation: Secondary | ICD-10-CM

## 2022-03-24 NOTE — Patient Instructions (Signed)
  Follow-Up: At [facility], you and your health needs are our priority.  As part of our continuing mission to provide you with exceptional heart care, we have created designated Provider Care Teams.  These Care Teams include your primary Cardiologist (physician) and Advanced Practice Providers (APPs -  Physician Assistants and Nurse Practitioners) who all work together to provide you with the care you need, when you need it.  We recommend signing up for the patient portal called "MyChart".  Sign up information is provided on this After Visit Summary.  MyChart is used to connect with patients for Virtual Visits (Telemedicine).  Patients are able to view lab/test results, encounter notes, upcoming appointments, etc.  Non-urgent messages can be sent to your provider as well.   To learn more about what you can do with MyChart, go to [email or web address].    Your next appointment:    As needed  Important Information About Sugar       

## 2022-05-28 ENCOUNTER — Emergency Department (HOSPITAL_COMMUNITY): Payer: Medicare Other

## 2022-05-28 ENCOUNTER — Encounter (HOSPITAL_COMMUNITY): Payer: Self-pay | Admitting: Emergency Medicine

## 2022-05-28 ENCOUNTER — Other Ambulatory Visit: Payer: Self-pay

## 2022-05-28 ENCOUNTER — Inpatient Hospital Stay (HOSPITAL_COMMUNITY)
Admission: EM | Admit: 2022-05-28 | Discharge: 2022-05-31 | DRG: 025 | Disposition: A | Discharge status: Home Health | Payer: Medicare Other | Attending: Neurological Surgery | Admitting: Neurological Surgery

## 2022-05-28 DIAGNOSIS — I10 Essential (primary) hypertension: Secondary | ICD-10-CM | POA: Diagnosis present

## 2022-05-28 DIAGNOSIS — R296 Repeated falls: Secondary | ICD-10-CM | POA: Diagnosis present

## 2022-05-28 DIAGNOSIS — I6200 Nontraumatic subdural hemorrhage, unspecified: Secondary | ICD-10-CM | POA: Diagnosis not present

## 2022-05-28 DIAGNOSIS — R270 Ataxia, unspecified: Secondary | ICD-10-CM | POA: Diagnosis present

## 2022-05-28 DIAGNOSIS — S065XAA Traumatic subdural hemorrhage with loss of consciousness status unknown, initial encounter: Principal | ICD-10-CM

## 2022-05-28 DIAGNOSIS — G935 Compression of brain: Secondary | ICD-10-CM | POA: Diagnosis present

## 2022-05-28 LAB — COMPREHENSIVE METABOLIC PANEL
ALT: 21 U/L (ref 0–44)
AST: 29 U/L (ref 15–41)
Albumin: 3.9 g/dL (ref 3.5–5.0)
Alkaline Phosphatase: 86 U/L (ref 38–126)
Anion gap: 7 (ref 5–15)
BUN: 17 mg/dL (ref 8–23)
CO2: 21 mmol/L — ABNORMAL LOW (ref 22–32)
Calcium: 8.5 mg/dL — ABNORMAL LOW (ref 8.9–10.3)
Chloride: 108 mmol/L (ref 98–111)
Creatinine, Ser: 1.12 mg/dL (ref 0.61–1.24)
GFR, Estimated: >60 mL/min (ref >60)
Glucose, Bld: 196 mg/dL — ABNORMAL HIGH (ref 70–99)
Potassium: 3.6 mmol/L (ref 3.5–5.1)
Sodium: 136 mmol/L (ref 135–145)
Total Bilirubin: 0.8 mg/dL (ref 0.3–1.2)
Total Protein: 7.1 g/dL (ref 6.5–8.1)

## 2022-05-28 LAB — CBG MONITORING, ED: Glucose-Capillary: 202 mg/dL — ABNORMAL HIGH (ref 70–99)

## 2022-05-28 LAB — CBC WITH DIFFERENTIAL/PLATELET
Abs Immature Granulocytes: 0.05 10*3/uL (ref 0.00–0.07)
Basophils Absolute: 0 10*3/uL (ref 0.0–0.1)
Basophils Relative: 0 %
Eosinophils Absolute: 0.6 10*3/uL — ABNORMAL HIGH (ref 0.0–0.5)
Eosinophils Relative: 5 %
HCT: 36.6 % — ABNORMAL LOW (ref 39.0–52.0)
Hemoglobin: 12.1 g/dL — ABNORMAL LOW (ref 13.0–17.0)
Immature Granulocytes: 0 %
Lymphocytes Relative: 11 %
Lymphs Abs: 1.4 10*3/uL (ref 0.7–4.0)
MCH: 30 pg (ref 26.0–34.0)
MCHC: 33.1 g/dL (ref 30.0–36.0)
MCV: 90.6 fL (ref 80.0–100.0)
Monocytes Absolute: 1.2 10*3/uL — ABNORMAL HIGH (ref 0.1–1.0)
Monocytes Relative: 9 %
Neutro Abs: 9.3 10*3/uL — ABNORMAL HIGH (ref 1.7–7.7)
Neutrophils Relative %: 75 %
Platelets: 195 10*3/uL (ref 150–400)
RBC: 4.04 MIL/uL — ABNORMAL LOW (ref 4.22–5.81)
RDW: 13.8 % (ref 11.5–15.5)
WBC: 12.5 10*3/uL — ABNORMAL HIGH (ref 4.0–10.5)
nRBC: 0 % (ref 0.0–0.2)

## 2022-05-28 NOTE — ED Provider Triage Note (Signed)
  Emergency Medicine Provider Triage Evaluation Note  MRN:  [id number]  Arrival date & time: 05/28/22    Medically screening exam initiated at 10:13 PM.   CC:   Frequent Falls / Weakness   HPI:  Danny Hudson is a 88 y.o. year-old male presents to the ED with chief complaint of recurrent falls.  Worse today.  Right leg weakness.  No obvious trauma.  History provided by patient and daughter ROS:  -As included in HPI PE:   Vitals:   05/28/22 2153  BP: 130/74  Pulse: 98  Resp: 18  Temp: 98.7 F (37.1 C)  SpO2: 97%    Non-toxic appearing No respiratory distress  MDM:  Based on signs and symptoms, deconditioning is highest on my differential, followed by stroke. I've ordered labs and imaging in triage to expedite lab/diagnostic workup.  Patient was informed that the remainder of the evaluation will be completed by another provider, this initial triage assessment does not replace that evaluation, and the importance of remaining in the ED until their evaluation is complete.    Montine Circle, PA-C 05/28/22 2215

## 2022-05-28 NOTE — ED Triage Notes (Signed)
Patient's daughter reported frequent falls at home for the past several weeks and 3x today , denies injury or pain , she adds generalized weakness/legs weakness.

## 2022-05-29 ENCOUNTER — Inpatient Hospital Stay (HOSPITAL_COMMUNITY): Payer: Medicare Other

## 2022-05-29 ENCOUNTER — Encounter (HOSPITAL_COMMUNITY)
Admission: EM | Disposition: A | Discharge status: Home Health | Payer: Self-pay | Source: Home / Self Care | Attending: Neurological Surgery

## 2022-05-29 ENCOUNTER — Emergency Department (HOSPITAL_COMMUNITY): Payer: Medicare Other

## 2022-05-29 ENCOUNTER — Inpatient Hospital Stay (HOSPITAL_COMMUNITY): Payer: Medicare Other | Admitting: Certified Registered Nurse Anesthetist

## 2022-05-29 ENCOUNTER — Encounter (HOSPITAL_COMMUNITY): Payer: Self-pay | Admitting: Neurological Surgery

## 2022-05-29 DIAGNOSIS — I4891 Unspecified atrial fibrillation: Secondary | ICD-10-CM | POA: Diagnosis not present

## 2022-05-29 DIAGNOSIS — D649 Anemia, unspecified: Secondary | ICD-10-CM | POA: Diagnosis not present

## 2022-05-29 DIAGNOSIS — S065XAA Traumatic subdural hemorrhage with loss of consciousness status unknown, initial encounter: Secondary | ICD-10-CM | POA: Diagnosis not present

## 2022-05-29 DIAGNOSIS — R270 Ataxia, unspecified: Secondary | ICD-10-CM | POA: Diagnosis present

## 2022-05-29 DIAGNOSIS — I6200 Nontraumatic subdural hemorrhage, unspecified: Secondary | ICD-10-CM | POA: Diagnosis present

## 2022-05-29 DIAGNOSIS — I10 Essential (primary) hypertension: Secondary | ICD-10-CM | POA: Diagnosis present

## 2022-05-29 DIAGNOSIS — R296 Repeated falls: Secondary | ICD-10-CM | POA: Diagnosis present

## 2022-05-29 DIAGNOSIS — G935 Compression of brain: Secondary | ICD-10-CM | POA: Diagnosis present

## 2022-05-29 HISTORY — PX: BURR HOLE: SHX908

## 2022-05-29 LAB — URINALYSIS, ROUTINE W REFLEX MICROSCOPIC
Bilirubin Urine: NEGATIVE
Glucose, UA: NEGATIVE mg/dL
Hgb urine dipstick: NEGATIVE
Ketones, ur: NEGATIVE mg/dL
Leukocytes,Ua: NEGATIVE
Nitrite: NEGATIVE
Protein, ur: NEGATIVE mg/dL
Specific Gravity, Urine: 1.009 (ref 1.005–1.030)
pH: 6 (ref 5.0–8.0)

## 2022-05-29 LAB — PROTIME-INR
INR: 1 (ref 0.8–1.2)
Prothrombin Time: 13.1 seconds (ref 11.4–15.2)

## 2022-05-29 LAB — TYPE AND SCREEN
ABO/RH(D): B POS
Antibody Screen: NEGATIVE

## 2022-05-29 LAB — MRSA NEXT GEN BY PCR, NASAL: MRSA by PCR Next Gen: NOT DETECTED

## 2022-05-29 LAB — CBG MONITORING, ED: Glucose-Capillary: 107 mg/dL — ABNORMAL HIGH (ref 70–99)

## 2022-05-29 LAB — ABO/RH: ABO/RH(D): B POS

## 2022-05-29 SURGERY — CREATION, CRANIAL BURR HOLE
Anesthesia: General | Laterality: Left

## 2022-05-29 MED ORDER — CHLORHEXIDINE GLUCONATE 0.12 % MT SOLN: 15.0000 mL | Freq: Once | OROMUCOSAL | Status: AC

## 2022-05-29 MED ORDER — SODIUM CHLORIDE 0.9 % IV SOLN: INTRAVENOUS | Status: DC

## 2022-05-29 MED ORDER — DEXAMETHASONE SODIUM PHOSPHATE 10 MG/ML IJ SOLN
INTRAMUSCULAR | Status: DC | PRN
  Administered 2022-05-29: 10 mg via INTRAVENOUS

## 2022-05-29 MED ORDER — PROPOFOL 10 MG/ML IV BOLUS
INTRAVENOUS | Status: AC
  Filled 2022-05-29: qty 20

## 2022-05-29 MED ORDER — PROMETHAZINE HCL 25 MG PO TABS: 12.5000 mg | ORAL_TABLET | ORAL | Status: DC | PRN

## 2022-05-29 MED ORDER — PHENYLEPHRINE 80 MCG/ML (10ML) SYRINGE FOR IV PUSH (FOR BLOOD PRESSURE SUPPORT)
PREFILLED_SYRINGE | INTRAVENOUS | Status: AC
  Filled 2022-05-29: qty 10

## 2022-05-29 MED ORDER — FENTANYL CITRATE (PF) 250 MCG/5ML IJ SOLN
INTRAMUSCULAR | Status: AC
  Filled 2022-05-29: qty 5

## 2022-05-29 MED ORDER — LIDOCAINE 2% (20 MG/ML) 5 ML SYRINGE
INTRAMUSCULAR | Status: DC | PRN
  Administered 2022-05-29: 40 mg via INTRAVENOUS

## 2022-05-29 MED ORDER — LABETALOL HCL 5 MG/ML IV SOLN
INTRAVENOUS | Status: AC
  Filled 2022-05-29: qty 4

## 2022-05-29 MED ORDER — LIDOCAINE 2% (20 MG/ML) 5 ML SYRINGE
INTRAMUSCULAR | Status: AC
  Filled 2022-05-29: qty 5

## 2022-05-29 MED ORDER — FENTANYL CITRATE (PF) 100 MCG/2ML IJ SOLN: 25.0000 ug | INTRAMUSCULAR | Status: DC | PRN

## 2022-05-29 MED ORDER — SUCCINYLCHOLINE CHLORIDE 200 MG/10ML IV SOSY
PREFILLED_SYRINGE | INTRAVENOUS | Status: AC
  Filled 2022-05-29: qty 10

## 2022-05-29 MED ORDER — ROCURONIUM BROMIDE 10 MG/ML (PF) SYRINGE
PREFILLED_SYRINGE | INTRAVENOUS | Status: DC | PRN
  Administered 2022-05-29: 50 mg via INTRAVENOUS

## 2022-05-29 MED ORDER — ACETAMINOPHEN 325 MG PO TABS
650.0000 mg | ORAL_TABLET | ORAL | Status: DC | PRN
  Administered 2022-05-31: 650 mg via ORAL
  Filled 2022-05-29 (×2): qty 2

## 2022-05-29 MED ORDER — THROMBIN 5000 UNITS EX SOLR
CUTANEOUS | Status: AC
  Filled 2022-05-29: qty 5000

## 2022-05-29 MED ORDER — HYDROMORPHONE HCL 1 MG/ML IJ SOLN: 0.5000 mg | INTRAMUSCULAR | Status: DC | PRN

## 2022-05-29 MED ORDER — LIDOCAINE-EPINEPHRINE 1 %-1:100000 IJ SOLN
INTRAMUSCULAR | Status: DC | PRN
  Administered 2022-05-29: 5 mL

## 2022-05-29 MED ORDER — ORAL CARE MOUTH RINSE: 15.0000 mL | Freq: Once | OROMUCOSAL | Status: AC

## 2022-05-29 MED ORDER — POLYETHYLENE GLYCOL 3350 17 G PO PACK: 17.0000 g | PACK | Freq: Every day | ORAL | Status: DC | PRN

## 2022-05-29 MED ORDER — ONDANSETRON HCL 4 MG PO TABS: 4.0000 mg | ORAL_TABLET | ORAL | Status: DC | PRN

## 2022-05-29 MED ORDER — CHLORHEXIDINE GLUCONATE CLOTH 2 % EX PADS
6.0000 | MEDICATED_PAD | Freq: Every day | CUTANEOUS | Status: DC
  Administered 2022-05-29 – 2022-05-30 (×2): 6 via TOPICAL

## 2022-05-29 MED ORDER — ONDANSETRON HCL 4 MG/2ML IJ SOLN: 4.0000 mg | INTRAMUSCULAR | Status: DC | PRN

## 2022-05-29 MED ORDER — ROCURONIUM BROMIDE 10 MG/ML (PF) SYRINGE
PREFILLED_SYRINGE | INTRAVENOUS | Status: AC
  Filled 2022-05-29: qty 10

## 2022-05-29 MED ORDER — PHENYLEPHRINE 80 MCG/ML (10ML) SYRINGE FOR IV PUSH (FOR BLOOD PRESSURE SUPPORT)
PREFILLED_SYRINGE | INTRAVENOUS | Status: DC | PRN
  Administered 2022-05-29: 40 ug via INTRAVENOUS

## 2022-05-29 MED ORDER — BACITRACIN ZINC 500 UNIT/GM EX OINT
TOPICAL_OINTMENT | CUTANEOUS | Status: AC
  Filled 2022-05-29: qty 28.35

## 2022-05-29 MED ORDER — SODIUM CHLORIDE 0.9 % IV SOLN: INTRAVENOUS | Status: DC | PRN

## 2022-05-29 MED ORDER — FENTANYL CITRATE (PF) 250 MCG/5ML IJ SOLN
INTRAMUSCULAR | Status: DC | PRN
  Administered 2022-05-29: 50 ug via INTRAVENOUS
  Administered 2022-05-29 (×2): 25 ug via INTRAVENOUS

## 2022-05-29 MED ORDER — ONDANSETRON HCL 4 MG/2ML IJ SOLN
INTRAMUSCULAR | Status: DC | PRN
  Administered 2022-05-29: 4 mg via INTRAVENOUS

## 2022-05-29 MED ORDER — ACETAMINOPHEN 650 MG RE SUPP: 650.0000 mg | RECTAL | Status: DC | PRN

## 2022-05-29 MED ORDER — ONDANSETRON HCL 4 MG/2ML IJ SOLN
INTRAMUSCULAR | Status: AC
  Filled 2022-05-29: qty 2

## 2022-05-29 MED ORDER — BACITRACIN ZINC 500 UNIT/GM EX OINT
TOPICAL_OINTMENT | CUTANEOUS | Status: DC | PRN
  Administered 2022-05-29: 1 via TOPICAL

## 2022-05-29 MED ORDER — CHLORHEXIDINE GLUCONATE 0.12 % MT SOLN
OROMUCOSAL | Status: AC
  Administered 2022-05-29: 15 mL via OROMUCOSAL
  Filled 2022-05-29: qty 15

## 2022-05-29 MED ORDER — LABETALOL HCL 5 MG/ML IV SOLN
10.0000 mg | INTRAVENOUS | Status: DC | PRN
  Administered 2022-05-29: 30 mg via INTRAVENOUS
  Administered 2022-05-29 (×2): 10 mg via INTRAVENOUS
  Filled 2022-05-29: qty 8

## 2022-05-29 MED ORDER — DEXAMETHASONE SODIUM PHOSPHATE 10 MG/ML IJ SOLN
INTRAMUSCULAR | Status: AC
  Filled 2022-05-29: qty 1

## 2022-05-29 MED ORDER — THROMBIN 5000 UNITS EX SOLR
OROMUCOSAL | Status: DC | PRN
  Administered 2022-05-29: 5 mL via TOPICAL

## 2022-05-29 MED ORDER — HYDROCODONE-ACETAMINOPHEN 5-325 MG PO TABS: 1.0000 | ORAL_TABLET | ORAL | Status: DC | PRN

## 2022-05-29 MED ORDER — 0.9 % SODIUM CHLORIDE (POUR BTL) OPTIME
TOPICAL | Status: DC | PRN
  Administered 2022-05-29: 1000 mL

## 2022-05-29 MED ORDER — LIDOCAINE-EPINEPHRINE 1 %-1:100000 IJ SOLN
INTRAMUSCULAR | Status: AC
  Filled 2022-05-29: qty 1

## 2022-05-29 MED ORDER — PROPOFOL 10 MG/ML IV BOLUS
INTRAVENOUS | Status: DC | PRN
  Administered 2022-05-29: 80 mg via INTRAVENOUS

## 2022-05-29 MED ORDER — CEFAZOLIN SODIUM-DEXTROSE 2-4 GM/100ML-% IV SOLN
2.0000 g | INTRAVENOUS | Status: AC
  Administered 2022-05-29: 2 g via INTRAVENOUS
  Filled 2022-05-29: qty 100

## 2022-05-29 MED ORDER — SUGAMMADEX SODIUM 200 MG/2ML IV SOLN
INTRAVENOUS | Status: DC | PRN
  Administered 2022-05-29: 200 mg via INTRAVENOUS

## 2022-05-29 MED ORDER — DOCUSATE SODIUM 100 MG PO CAPS
100.0000 mg | ORAL_CAPSULE | Freq: Two times a day (BID) | ORAL | Status: DC
  Administered 2022-05-30 – 2022-05-31 (×2): 100 mg via ORAL
  Filled 2022-05-29 (×3): qty 1

## 2022-05-29 SURGICAL SUPPLY — 63 items
BAG COUNTER SPONGE SURGICOUNT (BAG) ×1 IMPLANT
BAG SPNG CNTER NS LX DISP (BAG) ×1
BLADE CLIPPER SURG (BLADE) ×1 IMPLANT
BNDG GAUZE DERMACEA FLUFF 4 (GAUZE/BANDAGES/DRESSINGS) IMPLANT
BNDG GZE DERMACEA 4 6PLY (GAUZE/BANDAGES/DRESSINGS)
BUR ACORN 9.0 PRECISION (BURR) ×1 IMPLANT
BUR MATCHSTICK NEURO 3.0 LAGG (BURR) IMPLANT
BUR SPIRAL ROUTER 2.3 (BUR) IMPLANT
CANISTER SUCT 3000ML PPV (MISCELLANEOUS) ×1 IMPLANT
CLIP VESOCCLUDE MED 6/CT (CLIP) IMPLANT
DRAPE NEUROLOGICAL W/INCISE (DRAPES) ×1 IMPLANT
DRAPE SHEET LG 3/4 BI-LAMINATE (DRAPES) ×1 IMPLANT
DRAPE SURG 17X23 STRL (DRAPES) IMPLANT
DRAPE WARM FLUID 44X44 (DRAPES) ×1 IMPLANT
DURAPREP 6ML APPLICATOR 50/CS (WOUND CARE) ×1 IMPLANT
ELECT REM PT RETURN 9FT ADLT (ELECTROSURGICAL) ×1
ELECTRODE REM PT RTRN 9FT ADLT (ELECTROSURGICAL) ×1 IMPLANT
EVACUATOR 1/8 PVC DRAIN (DRAIN) IMPLANT
EVACUATOR SILICONE 100CC (DRAIN) IMPLANT
GAUZE 4X4 16PLY ~~LOC~~+RFID DBL (SPONGE) IMPLANT
GAUZE SPONGE 4X4 12PLY STRL (GAUZE/BANDAGES/DRESSINGS) ×1 IMPLANT
GLOVE BIOGEL PI IND STRL 7.5 (GLOVE) ×2 IMPLANT
GLOVE ECLIPSE 7.5 STRL STRAW (GLOVE) ×2 IMPLANT
GLOVE EXAM NITRILE LRG STRL (GLOVE) IMPLANT
GLOVE EXAM NITRILE XL STR (GLOVE) IMPLANT
GLOVE EXAM NITRILE XS STR PU (GLOVE) IMPLANT
GOWN STRL REUS W/ TWL LRG LVL3 (GOWN DISPOSABLE) ×2 IMPLANT
GOWN STRL REUS W/ TWL XL LVL3 (GOWN DISPOSABLE) IMPLANT
GOWN STRL REUS W/TWL 2XL LVL3 (GOWN DISPOSABLE) IMPLANT
GOWN STRL REUS W/TWL LRG LVL3 (GOWN DISPOSABLE) ×2
GOWN STRL REUS W/TWL XL LVL3 (GOWN DISPOSABLE)
HEMOSTAT POWDER KIT SURGIFOAM (HEMOSTASIS) ×1 IMPLANT
HEMOSTAT SURGICEL 2X14 (HEMOSTASIS) IMPLANT
HOOK DURA 1/2IN (MISCELLANEOUS) ×1 IMPLANT
KIT BASIN OR (CUSTOM PROCEDURE TRAY) ×1 IMPLANT
KIT TURNOVER KIT B (KITS) ×1 IMPLANT
NEEDLE HYPO 22GX1.5 SAFETY (NEEDLE) ×1 IMPLANT
NS IRRIG 1000ML POUR BTL (IV SOLUTION) ×1 IMPLANT
PACK CRANIOTOMY CUSTOM (CUSTOM PROCEDURE TRAY) ×1 IMPLANT
PATTIES SURGICAL .5 X.5 (GAUZE/BANDAGES/DRESSINGS) IMPLANT
PATTIES SURGICAL .5 X3 (DISPOSABLE) IMPLANT
PATTIES SURGICAL 1X1 (DISPOSABLE) IMPLANT
SPONGE NEURO XRAY DETECT 1X3 (DISPOSABLE) IMPLANT
SPONGE SURGIFOAM ABS GEL SZ50 (HEMOSTASIS) ×1 IMPLANT
STAPLER VISISTAT 35W (STAPLE) ×1 IMPLANT
STOCKINETTE 6  STRL (DRAPES)
STOCKINETTE 6 STRL (DRAPES) IMPLANT
SUT ETHILON 3 0 FSL (SUTURE) IMPLANT
SUT ETHILON 3 0 PS 1 (SUTURE) IMPLANT
SUT MNCRL AB 3-0 PS2 18 (SUTURE) ×1 IMPLANT
SUT NURALON 4 0 TR CR/8 (SUTURE) ×1 IMPLANT
SUT STEEL 0 (SUTURE)
SUT STEEL 0 18XMFL TIE 17 (SUTURE) IMPLANT
SUT VIC AB 0 CT1 18XCR BRD8 (SUTURE) ×1 IMPLANT
SUT VIC AB 0 CT1 8-18 (SUTURE) ×1
SUT VIC AB 2-0 CT1 27 (SUTURE) ×3
SUT VIC AB 2-0 CT1 TAPERPNT 27 (SUTURE) IMPLANT
TOWEL GREEN STERILE (TOWEL DISPOSABLE) ×1 IMPLANT
TOWEL GREEN STERILE FF (TOWEL DISPOSABLE) ×1 IMPLANT
TRAY FOLEY MTR SLVR 16FR STAT (SET/KITS/TRAYS/PACK) ×1 IMPLANT
TUBE CONNECTING 12X1/4 (SUCTIONS) ×1 IMPLANT
UNDERPAD 30X36 HEAVY ABSORB (UNDERPADS AND DIAPERS) ×1 IMPLANT
WATER STERILE IRR 1000ML POUR (IV SOLUTION) ×1 IMPLANT

## 2022-05-29 NOTE — Anesthesia Preprocedure Evaluation (Addendum)
Anesthesia Evaluation  Patient identified by MRN, date of birth, ID band Patient awake    Reviewed: Allergy & Precautions, NPO status , Patient's Chart, lab work & pertinent test results  History of Anesthesia Complications Negative for: history of anesthetic complications  Airway Mallampati: II  TM Distance: >3 FB Neck ROM: Full    Dental  (+) Teeth Intact, Dental Advisory Given   Pulmonary neg pulmonary ROS,    breath sounds clear to auscultation       Cardiovascular hypertension, Pt. on medications + Peripheral Vascular Disease  + dysrhythmias Atrial Fibrillation  Rhythm:Regular Rate:Normal  Echo 05/14/20: EF 60-65%, no RWMA, mild LVH, normal RVSF, mild MR, trivial AR   Neuro/Psych Large left SDH with progressive ataxia/R sided weakness    GI/Hepatic negative GI ROS, Neg liver ROS,   Endo/Other  negative endocrine ROS  Renal/GU negative Renal ROS  negative genitourinary   Musculoskeletal negative musculoskeletal ROS (+)   Abdominal Normal abdominal exam  (+)   Peds  Hematology  (+) Blood dyscrasia, anemia ,   Anesthesia Other Findings   Reproductive/Obstetrics                          Anesthesia Physical Anesthesia Plan  ASA: 4 and emergent  Anesthesia Plan: General   Post-op Pain Management:    Induction: Intravenous  PONV Risk Score and Plan: 2 and Ondansetron, Dexamethasone and Treatment may vary due to age or medical condition  Airway Management Planned: Oral ETT  Additional Equipment: Arterial line  Intra-op Plan:   Post-operative Plan: Extubation in OR and Possible Post-op intubation/ventilation  Informed Consent: I have reviewed the patients History and Physical, chart, labs and discussed the procedure including the risks, benefits and alternatives for the proposed anesthesia with the patient or authorized representative who has indicated his/her understanding and  acceptance.     Interpreter used for [employer] Discussed with:   Anesthesia Plan Comments:      Anesthesia Quick Evaluation

## 2022-05-29 NOTE — Op Note (Signed)
PATIENT: Danny Hudson  DAY OF SURGERY: 05/29/22   PRE-OPERATIVE DIAGNOSIS:  Left subdural hematoma   POST-OPERATIVE DIAGNOSIS:  Same   PROCEDURE:  Left burr hole drainage of subdural hematoma   SURGEON:  Surgeon(s) and Role:    Judith Part, MD - Primary   ANESTHESIA: ETGA   BRIEF HISTORY: This is a 88 year old man who presented with progressive ataxia and falls then right sided weakness. CTH showed a 3.6cm left subdural hematoma. I therefore recommended burr hole drainage. This was discussed with the patient as well as risks, benefits, and alternatives and wished to proceed with surgery.   OPERATIVE DETAIL: The patient was taken to the operating room, anesthesia was induced by the anesthesia team, and the patient was placed on the OR table in the supine position. A formal time out was performed with two patient identifiers and confirmed the operative site. The operative site was marked, hair was clipped with surgical clippers, the area was then prepped and draped in a sterile fashion.   A linear incision was placed in the left frontoparietal region. Retractor was placed and high speed drill was used to create a burr hole. The dura was opened with egress of subacute blood products under pressure. This was evacuated and the subdural space was copiously irrigated until it returned clear. No active hemorrhage was seen.   The wound was copiously irrigated, all instrument and sponge counts were correct, and the incision was then closed in layers. The patient was then returned to anesthesia for emergence. No apparent complications at the completion of the procedure.   EBL:  32m   DRAINS: none   SPECIMENS: none   TJudith Part MD 05/29/22 6:07 PM

## 2022-05-29 NOTE — Transfer of Care (Signed)
Immediate Anesthesia Transfer of Care Note  Patient: KALETH KOY  Procedure(s) Performed: LEFT BURR HOLE FOR SUBDURAL HEMATOMA (Left)  Patient Location: PACU  Anesthesia Type:General  Level of Consciousness: awake and alert   Airway & Oxygen Therapy: Patient Spontanous Breathing and Patient connected to nasal cannula oxygen  Post-op Assessment: Report given to RN and Post -op Vital signs reviewed and stable  Post vital signs: Reviewed and stable  Last Vitals:  Vitals Value Taken Time  BP 160/73 05/29/22 1935  Temp 36.4 C 05/29/22 1935  Pulse 81 05/29/22 1938  Resp 14 05/29/22 1938  SpO2 95 % 05/29/22 1938  Vitals shown include unvalidated device data.  Last Pain:  Vitals:   05/29/22 1800  TempSrc: Oral  PainSc: 0-No pain         Complications: No notable events documented.

## 2022-05-29 NOTE — H&P (Signed)
Neurosurgery H&P  CC: Right sided weakness / falls  HPI: This is a 88 y.o. man that presents with progressive worsening ataxia and falls for a few weeks to months, significantly worse in the past few days with eheadaches / dizziness, and progressive RLE > RUE weakness. No known clear inciting event, but did have a fairly significant fall that prompted him coming to the ED recently, however reports he did not strike his head. No recent use of anti-platelet or anti-coagulant medications except for ASA81.   ROS: A 14 point ROS was performed and is negative except as noted in the HPI.   PMHx:  Past Medical History:  Diagnosis Date   ALLERGIC RHINITIS 01/21/2007   Qualifier: Diagnosis of  By: Larose Kells MD, [place]    Gout of ankle 03/21/2015   HEMORRHOIDS, INTERNAL W/O COMPLICATION 7/98/9211   Qualifier: Diagnosis of  By: Larose Kells MD, [place]    HYPERTENSION 01/21/2007   Qualifier: Diagnosis of  By: Larose Kells MD, [place]    FamHx:  Family History  Problem Relation Age of Onset   Sudden death Father 56   SocHx:  reports that he has never smoked. He has never used smokeless tobacco. He reports that he does not currently use alcohol. He reports that he does not currently use drugs.  Exam: Vital signs in last 24 hours: Temp:  [97.9 F (36.6 C)-98.7 F (37.1 C)] 98.1 F (36.7 C) (09/14 0800) Pulse Rate:  [73-98] 78 (09/14 0900) Resp:  [13-21] 18 (09/14 0900) BP: (116-155)/(64-96) 151/83 (09/14 0900) SpO2:  [93 %-97 %] 93 % (09/14 0900) General: Awake, alert, cooperative, lying in bed in NAD Head: Normocephalic and atruamatic HEENT: Neck supple Pulmonary: breathing room air comfortably, no evidence of increased work of breathing Cardiac: RRR Abdomen: S NT ND Extremities: Warm and well perfused x4 Neuro: [place] speaking, awake/alert, speech fluent with reportedly normal content with some dysarthria per family, gaze conjugate, PERRL Strength 5/5 on L, RUE 4+/5, RLE externally rotated and 3/5, SILT on L with  mild R hemibody numbness   Assessment and Plan: 88 y.o. man with progressive ataxia / R sided weakness. [place] personally reviewed, which shows large left subdural hematoma.   -discussed with the patient and his family, recommend surgical drainage, discussed that he's 59 and the related risks of advanced age with this diagnosis but he appears to be very high functioning -OR today for burr hole drainage -NPO  Judith Part, MD 05/29/22 9:51 AM [facility]

## 2022-05-29 NOTE — TOC CAGE-AID Note (Signed)
Transition of Care [facility]) - CAGE-AID Screening   Patient Details  Name: PADDY NEIS MRN: [id number] Date of Birth: 09-29-1927  Transition of Care [facility]) CM/SW Contact:    Benard Halsted, LCSW Phone Number: 05/29/2022, 11:19 AM   Clinical Narrative: No ETOH use indicated by patient.    CAGE-AID Screening:    Have You Ever Felt You Ought to Cut Down on Your Drinking or Drug Use?: No Have People Annoyed You By Critizing Your Drinking Or Drug Use?: No Have You Felt Bad Or Guilty About Your Drinking Or Drug Use?: No Have You Ever Had a Drink or Used Drugs First Thing In The Morning to Steady Your Nerves or to Get Rid of a Hangover?: No CAGE-AID Score: 0  Substance Abuse Education Offered: No (Not indicated)

## 2022-05-29 NOTE — Anesthesia Postprocedure Evaluation (Signed)
Anesthesia Post Note  Patient: MINOR IDEN  Procedure(s) Performed: LEFT BURR HOLE FOR SUBDURAL HEMATOMA (Left)     Patient location during evaluation: PACU Anesthesia Type: General Level of consciousness: awake and alert Pain management: pain level controlled Vital Signs Assessment: post-procedure vital signs reviewed and stable Respiratory status: spontaneous breathing, nonlabored ventilation, respiratory function stable and patient connected to nasal cannula oxygen Cardiovascular status: blood pressure returned to baseline and stable Postop Assessment: no apparent nausea or vomiting Anesthetic complications: no   No notable events documented.  Last Vitals:  Vitals:   05/29/22 2035 05/29/22 2100  BP: (!) 149/81 (!) 144/69  Pulse: 71 72  Resp: 12 16  Temp: 36.5 C (!) 36.4 C  SpO2: 96% 95%    Last Pain:  Vitals:   05/29/22 2100  TempSrc: Oral  PainSc: 0-No pain                 Effie Berkshire

## 2022-05-29 NOTE — ED Provider Notes (Signed)
[facility]GENC[facility] DEPARTMENT Provider Note   CSN: [id number] Arrival date & time: 05/28/22  2125     History  Chief Complaint  Patient presents with   Frequent Falls / Weakness   Level 5 caveat due to acuity of condition SULLIVAN JACUINDE is a 88 y.o. male.  HPI Patient with history of hypertension presents for multiple falls.  Most of the history is provided by daughter.  It is reported the patient has had increasing falls since July.  Over the past several days its been worse and he is reported dizziness.  No syncope is reported.  He has been reporting right-sided weakness.  Daughter felt that things were worsening so she brought him in for evaluation No fevers or vomiting.  No chest pain is reported.   Past Medical History:  Diagnosis Date   ALLERGIC RHINITIS 01/21/2007   Qualifier: Diagnosis of  By: Larose Kells MD, [place].    Gout of ankle 03/21/2015   HEMORRHOIDS, INTERNAL W/O COMPLICATION 04/25/5725   Qualifier: Diagnosis of  By: Larose Kells MD, [place]    HYPERTENSION 01/21/2007   Qualifier: Diagnosis of  By: Larose Kells MD, [place]     Home Medications Prior to Admission medications   Medication Sig Start Date End Date Taking? Authorizing Provider  amLODipine-valsartan (EXFORGE) 5-160 MG tablet TAKE 1 TABLET BY MOUTH DAILY Patient taking differently: Take 1 tablet by mouth daily. 01/03/22   Minus Breeding, MD  Ascorbic Acid (VITAMIN C) 1000 MG tablet Take 500 mg by mouth See admin instructions. 500 mg in the morning  500 mg at noon    [provider]  aspirin EC 81 MG tablet Take 81 mg by mouth at bedtime.    [provider]  atorvastatin (LIPITOR) 80 MG tablet Take 1 tablet (80 mg total) by mouth daily. NEED OV. Patient taking differently: Take 80 mg by mouth every evening. 10/25/21   Minus Breeding, MD  Cholecalciferol (VITAMIN D3) 50 MCG (2000 UT) capsule Take 2,000 Units by mouth daily at 12 noon.    [provider]      Allergies    Patient has  no known allergies.    Review of Systems   Review of Systems  Unable to perform ROS: Acuity of condition    Physical Exam Updated Vital Signs BP 138/67   Pulse 76   Temp 98.7 F (37.1 C) (Oral)   Resp 18   SpO2 95%  Physical Exam CONSTITUTIONAL: Elderly, no acute distress HEAD: Normocephalic/atraumatic, no visible trauma EYES: EOMI/PERRL ENMT: Mucous membranes moist, no visible trauma NECK: supple no meningeal signs SPINE/BACK:entire spine nontender No bruising/crepitance/stepoffs noted to spine CV: S1/S2 noted, no murmurs/rubs/gallops noted LUNGS: Lungs are clear to auscultation bilaterally, no apparent distress ABDOMEN: soft, nontender NEURO: Pt is awake/alert/appropriate, moves all extremitiesx4.  No facial droop.   EXTREMITIES: pulses normal/equal, full ROM Pelvis stable. All other extremities/joints palpated/ranged and nontender SKIN: warm, color normal  ED Results / Procedures / Treatments   Labs (all labs ordered are listed, but only abnormal results are displayed) Labs Reviewed  CBC WITH DIFFERENTIAL/PLATELET - Abnormal; Notable for the following components:      Result Value   WBC 12.5 (*)    RBC 4.04 (*)    Hemoglobin 12.1 (*)    HCT 36.6 (*)    Neutro Abs 9.3 (*)    Monocytes Absolute 1.2 (*)    Eosinophils Absolute 0.6 (*)    All other components within normal limits  COMPREHENSIVE METABOLIC PANEL - Abnormal; Notable for the following components:   CO2 21 (*)    Glucose, Bld 196 (*)    Calcium 8.5 (*)    All other components within normal limits  CBG MONITORING, ED - Abnormal; Notable for the following components:   Glucose-Capillary 202 (*)    All other components within normal limits  CBG MONITORING, ED - Abnormal; Notable for the following components:   Glucose-Capillary 107 (*)    All other components within normal limits  PROTIME-INR  URINALYSIS, ROUTINE W REFLEX MICROSCOPIC  TYPE AND SCREEN  ABO/RH    EKG EKG  Interpretation  Date/Time:  Thursday May 29 2022 01:22:06 EDT Ventricular Rate:  82 PR Interval:  237 QRS Duration: 160 QT Interval:  431 QTC Calculation: 504 R Axis:   27 Text Interpretation: Sinus rhythm Prolonged PR interval Right bundle branch block Confirmed by Ripley Fraise [phone]) on 05/29/2022 1:45:48 AM  Radiology CT HEAD WO CONTRAST (5MM)  Result Date: 05/29/2022 CLINICAL DATA:  Fall weakness EXAM: CT HEAD WITHOUT CONTRAST TECHNIQUE: Contiguous axial images were obtained from the base of the skull through the vertex without intravenous contrast. RADIATION DOSE REDUCTION: This exam was performed according to the departmental dose-optimization program which includes automated exposure control, adjustment of the mA and/or kV according to patient size and/or use of iterative reconstruction technique. COMPARISON:  None Available. FINDINGS: Brain: Large isodense extra-axial left convexity hematoma, measures 3.6 cm maximum on axial images. Convex margins on coronal views but still favored to represent subdural hematoma. Right word midline shift up to 7 mm maximal on coronal images. Small right convexity subdural hematoma measuring 4 mm. Hematoma on the left exerts mass effect on the underlying brain parenchyma. There is atrophy and chronic small vessel ischemic changes of the white matter. Probable chronic lacunar infarcts in the thalamus. Ventricles are nonenlarged Vascular: No hyperdense vessels.  Carotid vascular calcification Skull: Normal. Negative for fracture or focal lesion. Sinuses/Orbits: Moderate mucosal thickening in the sinuses Other: None IMPRESSION: 1. Large left convexity extra-axial/probable isodense subdural hematoma measuring up to 3.6 cm maximum with about 7 mm midline shift to the right and mass effect on the underlying brain parenchyma. Trace 4 mm right convexity acute subdural hematoma. 2. Atrophy and chronic small vessel ischemic changes of the white matter. Critical  Value/emergent results were called by telephone at the time of interpretation on 05/29/2022 at 12:14 am to provider Montine Circle , who verbally acknowledged these results. Electronically Signed   By: Donavan Foil M.D.   On: 05/29/2022 00:14    Procedures .Critical Care  Performed by: Ripley Fraise, MD Authorized by: Ripley Fraise, MD   Critical care provider statement:    Critical care time (minutes):  60   Critical care start time:  05/29/2022 12:47 AM   Critical care end time:  05/29/2022 1:47 AM   Critical care time was exclusive of:  Separately billable procedures and treating other patients   Critical care was necessary to treat or prevent imminent or life-threatening deterioration of the following conditions:  CNS failure or compromise   Critical care was time spent personally by me on the following activities:  Re-evaluation of patient's condition, pulse oximetry, ordering and review of radiographic studies, ordering and review of laboratory studies, development of treatment plan with patient or surrogate, examination of patient and obtaining history from patient or surrogate   I assumed direction of critical care for this patient from another provider in my specialty: no  Care discussed with: admitting provider       Medications Ordered in ED Medications - No data to display  ED Course/ Medical Decision Making/ A&P Clinical Course as of 05/29/22 0147  Thu May 29, 2022  0042 WBC(!): 12.5 Leukocytosis [DW]  0042 Glucose(!): 196 Hyperglycemia [DW]  0042 CT head reveals large subdural hematoma with midline shift.  Patient currently awake and alert.  We will consult neurosurgery.  He takes aspirin, but no other anticoagulants [DW]  0104 Awaiting callback from neurosurgery, will page again [DW]  0117 Discussed with Dr. Venetia Constable.  We discussed findings.  He will admit to the hospital with likely operative management later in the morning unless patient deteriorates [DW]   0145 Discussed at length with her his daughter [name].  She reports they were planning to sign paperwork to the lawyers office for healthcare power of attorney later today.  She is explained to her father the need for surgery.  At this point surgery would not likely occur for the next 12 hours if patient deteriorates.  She will be available by phone for any questions.  Her number is [phone] [DW]    Clinical Course User Index [DW] Ripley Fraise, MD         Glasgow Coma Scale Score: 15                  Medical Decision Making Amount and/or Complexity of Data Reviewed Labs: ordered. Decision-making details documented in ED Course. Radiology: ordered.  Risk Decision regarding hospitalization.   This patient presents to the ED for concern of falls, weakness, this involves an extensive number of treatment options, and is a complaint that carries with it a high risk of complications and morbidity.  The differential diagnosis includes but is not limited to electrolyte abnormality, dehydration, CVA, acute coronary syndrome, intracranial hemorrhage  Comorbidities that complicate the patient evaluation: Patient's presentation is complicated by their history of hypertension  Social Determinants of Health: Patient's  English as a second language   increases the complexity of managing their presentation  Additional history obtained: Additional history obtained from family Records reviewed  cardiology notes reviewed  Lab Tests: I Ordered, and personally interpreted labs.  The pertinent results include: Hyperglycemia, leukocytosis  Imaging Studies ordered: I ordered imaging studies including CT scan head   I independently visualized and interpreted imaging which showed subdural hematoma I agree with the radiologist interpretation  Cardiac Monitoring: The patient was maintained on a cardiac monitor.  I personally viewed and interpreted the cardiac monitor which showed an underlying  rhythm of:  sinus rhythm   Critical Interventions:  Admission for operative management  Consultations Obtained: I requested consultation with the admitting physician neurosurgery , and discussed  findings as well as pertinent plan - they recommend: We will admit for operative management  Reevaluation: After the interventions noted above, I reevaluated the patient and found that they have :stayed the same  Complexity of problems addressed: Patient's presentation is most consistent with  acute presentation with potential threat to life or bodily function  Disposition: After consideration of the diagnostic results and the patient's response to treatment,  I feel that the patent would benefit from admission   .           Final Clinical Impression(s) / ED Diagnoses Final diagnoses:  Subdural hematoma [facility])    Rx / DC Orders ED Discharge Orders     None         Ripley Fraise, MD 05/29/22  0150  

## 2022-05-29 NOTE — Anesthesia Procedure Notes (Addendum)
Arterial Line Insertion Start/End9/14/2023 6:12 PM, 05/29/2022 6:12 PM Performed by: Effie Berkshire, MD, anesthesiologist  Patient location: OR. Preanesthetic checklist: patient identified, IV checked, site marked, risks and benefits discussed, surgical consent, monitors and equipment checked, pre-op evaluation, timeout performed and anesthesia consent Patient sedated Right, radial was placed Catheter size: 20 G Hand hygiene performed  and maximum sterile barriers used   Attempts: 1 Procedure performed without using ultrasound guided technique. Following insertion, dressing applied and Biopatch. Post procedure assessment: normal  Patient tolerated the procedure well with no immediate complications.

## 2022-05-29 NOTE — Anesthesia Procedure Notes (Signed)
Procedure Name: Intubation Date/Time: 05/29/2022 6:44 PM  Performed by: Lorie Phenix, CRNAPre-anesthesia Checklist: Patient identified, Emergency Drugs available, Suction available and Patient being monitored Patient Re-evaluated:Patient Re-evaluated prior to induction Oxygen Delivery Method: Circle system utilized Preoxygenation: Pre-oxygenation with 100% oxygen Induction Type: IV induction Ventilation: Mask ventilation without difficulty Laryngoscope Size: Mac and 4 Grade View: Grade I Tube type: Oral Tube size: 7.0 mm Number of attempts: 1 Airway Equipment and Method: Stylet Placement Confirmation: ETT inserted through vocal cords under direct vision, positive ETCO2 and breath sounds checked- equal and bilateral Secured at: 23 cm Tube secured with: Tape Dental Injury: Teeth and Oropharynx as per pre-operative assessment

## 2022-05-30 ENCOUNTER — Encounter (HOSPITAL_COMMUNITY): Payer: Self-pay | Admitting: Neurological Surgery

## 2022-05-30 ENCOUNTER — Other Ambulatory Visit: Payer: Self-pay

## 2022-05-30 NOTE — Progress Notes (Signed)
Neurosurgery Service Progress Note  Subjective: No acute events overnight, some confusion overnight per his wife, had to have a mitt placed on the right, but improving, doing well this morning   Objective: Vitals:   05/30/22 0300 05/30/22 0400 05/30/22 0500 05/30/22 0600  BP: (!) 128/48 (!) 152/66 (!) 120/59 124/64  Pulse: 66 75 62 64  Resp: '12 14 17 14  '$ Temp:   (!) 96 F (35.6 C)   TempSrc:      SpO2: 93% 95% (!) 87% 94%    Physical Exam: Awake/alert, some English but primarily [place] speaking, FS, strength 4+/5x4, improved from preop, no longer has ext rotated / weak RLE, more awake, incision c/d/i  Assessment & Plan: 88 y.o. man s/p burr hole evac of SDH, recovering well.  -post-op CTH with good evacuation, improved MLS, previously large cavity of subacute blood products now air and irrigation / hypodense material -transfer to floor -d/c A-line -PT/OT -if cleared by PT/OT and continues to do well, likely discharge home tomorrow -SCDs/TEDs, [facility] 9/16 if still inpatient  Judith Part  05/30/22 8:22 AM

## 2022-05-30 NOTE — Evaluation (Signed)
Occupational Therapy Evaluation Patient Details Name: Danny Hudson MRN: [id number] DOB: 18-Jul-1928 Today's Date: 05/30/2022   History of Present Illness 88 yo male presenting to ED on 9/13 with worsening ataxia and falls for a few weeks. [facility] personally reviewed, which shows large left subdural hematoma. S/p  Left burr hole drainage of subdural hematoma. PMH including HTN, hemorrhoids, and allergic rhinitis.   Clinical Impression   PTA, pt was living with his wife and daughter and was independent with ADLs; not using DME for mobility. Currently, pt requires Mod-Max A for LB ADLs and Min A for functional mobility using RW. Pt presenting with decreased balance, strength, and cognition. Discussing dc recommendation and need for RW with patient and wife; both verbalized understanding. Use of video interpreter throughout. Pt would benefit from further acute OT to facilitate safe dc. Recommend dc to home with HHOT for further OT to optimize safety, independence with ADLs, and return to PLOF.     Recommendations for follow up therapy are one component of a multi-disciplinary discharge planning process, led by the attending physician.  Recommendations may be updated based on patient status, additional functional criteria and insurance authorization.   Follow Up Recommendations  Home health OT    Assistance Recommended at Discharge Frequent or constant Supervision/Assistance  Patient can return home with the following A little help with walking and/or transfers;A little help with bathing/dressing/bathroom;Direct supervision/assist for medications management;Direct supervision/assist for financial management    Functional Status Assessment  Patient has had a recent decline in their functional status and demonstrates the ability to make significant improvements in function in a reasonable and predictable amount of time.  Equipment Recommendations  Other (comment) (RW)    Recommendations for Other  Services       Precautions / Restrictions Precautions Precautions: Fall      Mobility Bed Mobility Overal bed mobility: Needs Assistance Bed Mobility: Supine to Sit     Supine to sit: Min guard, HOB elevated     General bed mobility comments: Min Guard A for safety    Transfers Overall transfer level: Needs assistance Equipment used: None Transfers: Sit to/from Stand Sit to Stand: Min assist           General transfer comment: Min A for gaining balance      Balance Overall balance assessment: Needs assistance Sitting-balance support: No upper extremity supported, Feet supported Sitting balance-Leahy Scale: Fair     Standing balance support: Bilateral upper extremity supported, During functional activity Standing balance-Leahy Scale: Poor                             ADL either performed or assessed with clinical judgement   ADL Overall ADL's : Needs assistance/impaired Eating/Feeding: Set up;Sitting   Grooming: Set up;Supervision/safety;Sitting   Upper Body Bathing: Supervision/ safety;Set up;Sitting   Lower Body Bathing: Minimal assistance   Upper Body Dressing : Supervision/safety;Set up;Sitting   Lower Body Dressing: Moderate assistance;Sit to/from stand   Toilet Transfer: Minimal assistance;+2 for safety/equipment;Ambulation;Rolling walker (2 wheels) (Simulated to recliner)           Functional mobility during ADLs: Minimal assistance;Rolling walker (2 wheels) General ADL Comments: Pt presenting with decreased balance and activity tolerance. Very motivated     Vision Baseline Vision/History: 1 Wears glasses (reading glasses)       Perception     Praxis      Pertinent Vitals/Pain Pain Assessment Pain Assessment: Faces Faces Pain Scale:  Hurts little more Pain Location: HA Pain Descriptors / Indicators: Discomfort, Headache Pain Intervention(s): Monitored during session, Repositioned     Hand Dominance Right    Extremity/Trunk Assessment Upper Extremity Assessment Upper Extremity Assessment: Generalized weakness   Lower Extremity Assessment Lower Extremity Assessment: Defer to PT evaluation   Cervical / Trunk Assessment Cervical / Trunk Assessment: Kyphotic   Communication Communication Communication: Prefers language other than [place];Interpreter utilized ([place])   Cognition Arousal/Alertness: Awake/alert Behavior During Therapy: WFL for tasks assessed/performed Overall Cognitive Status: Difficult to assess                                 General Comments: Difficult to fully assess due to language barrier and HOH. Pt initially answering 1929 as current year, but after repeating questions he stated 2023. Requiring increased cues and time.     General Comments  Wife present throughout session    Exercises     Shoulder Instructions      Home Living Family/patient expects to be discharged to:: Private residence Living Arrangements: Spouse/significant other;Children (daughter) Available Help at Discharge: Family;Available 24 hours/day Type of Home: House Home Access: Stairs to enter [employer] of Steps: 1 Entrance Stairs-Rails: None Home Layout: Two level;Bed/bath upstairs Alternate Level Stairs-Number of Steps: flight Alternate Level Stairs-Rails: Left Bathroom Shower/Tub: Teacher, early years/pre: Standard     Home Equipment: Shower seat;Cane - single point          Prior Functioning/Environment Prior Level of Function : Independent/Modified Independent;History of Falls (last six months) (many falls since middle of July (0-2x/week))             Mobility Comments: pt not using DME for gait ADLs Comments: pt completes ADLs independently, heat up meals, not cooking. daughter helps organize medications        OT Problem List: Decreased strength;Decreased range of motion;Decreased activity tolerance;Impaired balance (sitting  and/or standing);Decreased knowledge of use of DME or AE;Decreased knowledge of precautions;Pain      OT Treatment/Interventions: Self-care/ADL training;Therapeutic exercise;Energy conservation;DME and/or AE instruction;Therapeutic activities;Patient/family education    OT Goals(Current goals can be found in the care plan section) Acute Rehab OT Goals Patient Stated Goal: Go home OT Goal Formulation: With patient Time For Goal Achievement: 06/13/22 Potential to Achieve Goals: Good  OT Frequency: Min 3X/week    Co-evaluation PT/OT/SLP Co-Evaluation/Treatment: Yes Reason for Co-Treatment: For patient/therapist safety;To address functional/ADL transfers (For interpreter use and inital assessment)   OT goals addressed during session: ADL's and self-care      AM-PAC OT "6 Clicks" Daily Activity     Outcome Measure Help from another person eating meals?: A Little Help from another person taking care of personal grooming?: A Little Help from another person toileting, which includes using toliet, bedpan, or urinal?: A Little Help from another person bathing (including washing, rinsing, drying)?: A Lot Help from another person to put on and taking off regular upper body clothing?: A Little Help from another person to put on and taking off regular lower body clothing?: A Lot 6 Click Score: 16   End of Session Equipment Utilized During Treatment: Gait belt;Rolling walker (2 wheels) Nurse Communication: Mobility status  Activity Tolerance: Patient tolerated treatment well Patient left: in chair;with call bell/phone within reach;with chair alarm set;with family/visitor present  OT Visit Diagnosis: Unsteadiness on feet (R26.81);Other abnormalities of gait and mobility (R26.89);Muscle weakness (generalized) (M62.81);Pain Pain - part of body:  (  HA)                Time: 3142-7670 OT Time Calculation (min): 32 min Charges:  OT General Charges $OT Visit: 1 Visit OT Evaluation $OT Eval  Moderate Complexity: 1 Mod  Ayahna Solazzo MSOT, OTR/L Acute Rehab Office: [place] 05/30/2022, 2:26 PM

## 2022-05-30 NOTE — Evaluation (Signed)
Physical Therapy Evaluation Patient Details Name: Danny Hudson MRN: [id number] DOB: Sep 16, 1927 Today's Date: 05/30/2022  History of Present Illness  88 yo male presenting to ED on 9/13 with worsening ataxia and falls for a few weeks. CTH shows large left subdural hematoma. S/p  Left burr hole drainage of subdural hematoma. PMH including HTN, hemorrhoids, and allergic rhinitis.   Clinical Impression  Pt in bed upon arrival of PT, agreeable to evaluation at this time. Prior to admission the pt was ambulating without use of DME, but per daughter has been having increased falls since July. The pt lives with his wife and daughter in a home with 1 step to enter and a flight of stairs to reach bedroom/bathroom. The pt now presents with limitations in functional mobility, power, dynamic stability, and activity tolerance due to above dx, and will continue to benefit from skilled PT to address these deficits. The pt required miNA to rise and steady in standing, and BUE support to complete hallway ambulation. Will benefit from additional session for stair training prior to anticipated d/c home.         Recommendations for follow up therapy are one component of a multi-disciplinary discharge planning process, led by the attending physician.  Recommendations may be updated based on patient status, additional functional criteria and insurance authorization.  Follow Up Recommendations Home health PT      Assistance Recommended at Discharge Frequent or constant Supervision/Assistance  Patient can return home with the following  A little help with walking and/or transfers;A little help with bathing/dressing/bathroom;Assistance with cooking/housework;Assistance with feeding;Direct supervision/assist for medications management;Direct supervision/assist for financial management;Assist for transportation;Help with stairs or ramp for entrance    Equipment Recommendations Rolling walker (2 wheels)  Recommendations  for Other Services       Functional Status Assessment Patient has had a recent decline in their functional status and demonstrates the ability to make significant improvements in function in a reasonable and predictable amount of time.     Precautions / Restrictions Precautions Precautions: Fall Restrictions Weight Bearing Restrictions: No      Mobility  Bed Mobility Overal bed mobility: Needs Assistance Bed Mobility: Supine to Sit     Supine to sit: Min guard, HOB elevated     General bed mobility comments: Min Guard A for safety    Transfers Overall transfer level: Needs assistance Equipment used: None Transfers: Sit to/from Stand Sit to Stand: Min assist           General transfer comment: Min A for gaining balance    Ambulation/Gait Ambulation/Gait assistance: Herbalist (Feet): 150 Feet Assistive device: Rolling walker (2 wheels), IV Pole Gait Pattern/deviations: Step-through pattern, Decreased stride length Gait velocity: slowed Gait velocity interpretation: 1.31 - 2.62 ft/sec, indicative of limited community ambulator   General Gait Details: pt with narrow BOS and slowed gait. BUE support on IV pole with minA, then progressed to minA with BUE support on RW     Balance Overall balance assessment: Needs assistance Sitting-balance support: No upper extremity supported, Feet supported Sitting balance-Leahy Scale: Fair     Standing balance support: Bilateral upper extremity supported, During functional activity Standing balance-Leahy Scale: Poor                               Pertinent Vitals/Pain Pain Assessment Pain Assessment: Faces Faces Pain Scale: Hurts little more Pain Location: HA Pain Descriptors / Indicators: Discomfort, Headache Pain  Intervention(s): Monitored during session, Repositioned    Home Living Family/patient expects to be discharged to:: Private residence Living Arrangements: Spouse/significant  other;Children (daughter) Available Help at Discharge: Family;Available 24 hours/day Type of Home: House Home Access: Stairs to enter Entrance Stairs-Rails: None Entrance Stairs-Number of Steps: 1 Alternate Level Stairs-Number of Steps: flight Home Layout: Two level;Bed/bath upstairs Home Equipment: Shower seat;Cane - single point Additional Comments: information from daughter    Prior Function Prior Level of Function : Independent/Modified Independent;History of Falls (last six months) (many falls since middle of July (0-2x/week))             Mobility Comments: pt not using DME for gait ADLs Comments: pt completes ADLs independently, heat up meals, not cooking. daughter helps organize medications     Hand Dominance   Dominant Hand: Right    Extremity/Trunk Assessment   Upper Extremity Assessment Upper Extremity Assessment: Defer to OT evaluation    Lower Extremity Assessment Lower Extremity Assessment: Generalized weakness    Cervical / Trunk Assessment Cervical / Trunk Assessment: Kyphotic  Communication   Communication: Prefers language other than [place];Interpreter utilized ([place])  Cognition Arousal/Alertness: Awake/alert Behavior During Therapy: WFL for tasks assessed/performed Overall Cognitive Status: Difficult to assess                                 General Comments: Difficult to fully assess due to language barrier and HOH. Pt initially answering 1929 as current year, but after repeating questions he stated 2023. Requiring increased cues and time.        General Comments General comments (skin integrity, edema, etc.): Wife present throughout session    Exercises     Assessment/Plan    PT Assessment Patient needs continued PT services  PT Problem List Decreased strength;Decreased activity tolerance;Decreased balance;Decreased mobility;Decreased coordination;Decreased safety awareness       PT Treatment Interventions DME  instruction;Gait training;Functional mobility training;Stair training;Therapeutic activities;Therapeutic exercise;Balance training;Neuromuscular re-education;Patient/family education    PT Goals (Current goals can be found in the Care Plan section)  Acute Rehab PT Goals Patient Stated Goal: return home PT Goal Formulation: With patient Time For Goal Achievement: 06/13/22 Potential to Achieve Goals: Good    Frequency Min 4X/week     Co-evaluation PT/OT/SLP Co-Evaluation/Treatment: Yes Reason for Co-Treatment: For patient/therapist safety (For interpreter use and inital assessment) PT goals addressed during session: Mobility/safety with mobility;Balance;Proper use of DME;Strengthening/ROM         AM-PAC PT "6 Clicks" Mobility  Outcome Measure Help needed turning from your back to your side while in a flat bed without using bedrails?: A Little Help needed moving from lying on your back to sitting on the side of a flat bed without using bedrails?: A Little Help needed moving to and from a bed to a chair (including a wheelchair)?: A Little Help needed standing up from a chair using your arms (e.g., wheelchair or bedside chair)?: A Little Help needed to walk in hospital room?: A Little Help needed climbing 3-5 steps with a railing? : A Lot 6 Click Score: 17    End of Session Equipment Utilized During Treatment: Gait belt Activity Tolerance: Patient tolerated treatment well Patient left: in chair;with call bell/phone within reach;with chair alarm set;with family/visitor present Nurse Communication: Mobility status PT Visit Diagnosis: Other abnormalities of gait and mobility (R26.89);Repeated falls (R29.6);Muscle weakness (generalized) (M62.81)    Time: 5852-7782 PT Time Calculation (min) (ACUTE ONLY): 32 min  Charges:   PT Evaluation $PT Eval Low Complexity: 1 Low          West Carbo, PT, DPT   Acute Rehabilitation Department  Sandra Cockayne 05/30/2022, 4:57 PM

## 2022-05-31 NOTE — Progress Notes (Signed)
Occupational Therapy Treatment Patient Details Name: Danny Hudson MRN: [id number] DOB: 17-Nov-1927 Today's Date: 05/31/2022   History of present illness 88 yo male presenting to ED on 9/13 with worsening ataxia and falls for a few weeks. CTH shows large left subdural hematoma. S/p  Left burr hole drainage of subdural hematoma. PMH including HTN, hemorrhoids, and allergic rhinitis.   OT comments  Pt progressing towards established OT goals. Focused session on education for shower transfer and LB dressing. Pt donning clothing with Supervision-Min guard demonstrating understanding of compensatory techniques. PT and wife verbalized understanding of shower transfer. Continue to recommend dc to home with HHOT and will continue to follow acutely as admitted.   Recommendations for follow up therapy are one component of a multi-disciplinary discharge planning process, led by the attending physician.  Recommendations may be updated based on patient status, additional functional criteria and insurance authorization.    Follow Up Recommendations  Home health OT    Assistance Recommended at Discharge Frequent or constant Supervision/Assistance  Patient can return home with the following  A little help with walking and/or transfers;A little help with bathing/dressing/bathroom;Direct supervision/assist for medications management;Direct supervision/assist for financial management   Equipment Recommendations  Other (comment) (RW)    Recommendations for Other Services      Precautions / Restrictions Precautions Precautions: Fall Restrictions Weight Bearing Restrictions: No       Mobility Bed Mobility               General bed mobility comments: sitting in recliner    Transfers Overall transfer level: Needs assistance Equipment used: Rolling walker (2 wheels) Transfers: Sit to/from Stand Sit to Stand: Min guard           General transfer comment: Cues to place hands on bed to push up  to stand, but pt pulling up on RW. No LOB, min guard for safety     Balance Overall balance assessment: Needs assistance Sitting-balance support: No upper extremity supported, Feet supported Sitting balance-Leahy Scale: Fair     Standing balance support: Bilateral upper extremity supported, During functional activity Standing balance-Leahy Scale: Poor                             ADL either performed or assessed with clinical judgement   ADL Overall ADL's : Needs assistance/impaired           Upper Body Bathing Details (indicate cue type and reason): Recommend getting hand held A for bathing     Upper Body Dressing : Supervision/safety;Sitting Upper Body Dressing Details (indicate cue type and reason): educating on donning head first Lower Body Dressing: Min guard;Sit to/from stand Lower Body Dressing Details (indicate cue type and reason): Educating on donning LB clothing without bending forward. Pt donning pants with Min guard A for safety in standing         Tub/ Shower Transfer: Walk-in shower;Rolling walker (2 wheels) Tub/Shower Transfer Details (indicate cue type and reason): Educaitng on stepping in shower backwards; both wife nad pt verbalizedu nderstanding Functional mobility during ADLs: Min guard;Rolling walker (2 wheels) General ADL Comments: Pt demonstrating increased balance compared to yesterdya session. Continue to require RW for safety. Educating on shower transfer and LB dressing    Extremity/Trunk Assessment Upper Extremity Assessment Upper Extremity Assessment: Generalized weakness   Lower Extremity Assessment Lower Extremity Assessment: Defer to PT evaluation   Cervical / Trunk Assessment Cervical / Trunk Assessment: Kyphotic  Vision       Perception     Praxis      Cognition Arousal/Alertness: Awake/alert Behavior During Therapy: WFL for tasks assessed/performed Overall Cognitive Status: Difficult to assess                                  General Comments: Difficult to fully assess due to language barrier and HOH. Pt following cues appropriately though.        Exercises      Shoulder Instructions       General Comments Wife present. Video interpreter used thorughout    Pertinent Vitals/ Pain       Pain Assessment Pain Assessment: Faces Faces Pain Scale: Hurts little more Pain Location: HA Pain Descriptors / Indicators: Discomfort, Headache Pain Intervention(s): Monitored during session, Repositioned  Home Living Family/patient expects to be discharged to:: Private residence Living Arrangements: Spouse/significant other;Children (daughter) Available Help at Discharge: Family;Available 24 hours/day Type of Home: House Home Access: Stairs to enter [employer] of Steps: 1 Entrance Stairs-Rails: None Home Layout: Two level;Bed/bath upstairs Alternate Level Stairs-Number of Steps: flight Alternate Level Stairs-Rails: Left Bathroom Shower/Tub: Teacher, early years/pre: Standard     Home Equipment: Marine scientist - single point   Additional Comments: information from daughter      Prior Functioning/Environment              Frequency  Min 3X/week        Progress Toward Goals  OT Goals(current goals can now be found in the care plan section)  Progress towards OT goals: Progressing toward goals  Acute Rehab OT Goals OT Goal Formulation: With patient Time For Goal Achievement: 06/13/22 Potential to Achieve Goals: Good ADL Goals Pt Will Perform Lower Body Dressing: with min guard assist;sit to/from stand Pt Will Transfer to Toilet: with min guard assist;bedside commode Pt Will Perform Toileting - Clothing Manipulation and hygiene: sit to/from stand;with min guard assist Pt Will Perform Tub/Shower Transfer: Shower transfer;with min guard assist;shower seat;ambulating;rolling walker  Plan Discharge plan remains appropriate     Co-evaluation                 AM-PAC OT "6 Clicks" Daily Activity     Outcome Measure   Help from another person eating meals?: A Little Help from another person taking care of personal grooming?: A Little Help from another person toileting, which includes using toliet, bedpan, or urinal?: A Little Help from another person bathing (including washing, rinsing, drying)?: A Lot Help from another person to put on and taking off regular upper body clothing?: A Little Help from another person to put on and taking off regular lower body clothing?: A Lot 6 Click Score: 16    End of Session Equipment Utilized During Treatment: Gait belt;Rolling walker (2 wheels)  OT Visit Diagnosis: Unsteadiness on feet (R26.81);Other abnormalities of gait and mobility (R26.89);Muscle weakness (generalized) (M62.81);Pain   Activity Tolerance Patient tolerated treatment well   Patient Left in chair;with call bell/phone within reach;with chair alarm set;with family/visitor present   Nurse Communication Mobility status        Time: 8502-7741 OT Time Calculation (min): 24 min  Charges: OT General Charges $OT Visit: 1 Visit OT Treatments $Self Care/Home Management : 23-37 mins  Tonga Prout MSOT, OTR/L Acute Rehab Office: [place] 05/31/2022, 1:26 PM

## 2022-05-31 NOTE — Progress Notes (Signed)
Physical Therapy Treatment Patient Details Name: Danny Hudson MRN: [id number] DOB: 04/20/1928 Today's Date: 05/31/2022   History of Present Illness 88 yo male presenting to ED on 9/13 with worsening ataxia and falls for a few weeks. CTH shows large left subdural hematoma. S/p  Left burr hole drainage of subdural hematoma. PMH including HTN, hemorrhoids, and allergic rhinitis.    PT Comments    Session focused on gait and stair training in anticipation of planned d/c home with his wife. Wife and pt educated on use of gait belt, use of RW, safe stair stepping pattern, guarding on stairs, and placement of rolling walkers in the home or transporting RW up/down stairs for pt use. He is displaying improved stability today, only requiring min guard assist for safety when ambulating using the RW and when negotiating a flight of stairs with handrail support. No LOB noted throughout session. Will continue to follow acutely. Current recommendations remain appropriate.      Recommendations for follow up therapy are one component of a multi-disciplinary discharge planning process, led by the attending physician.  Recommendations may be updated based on patient status, additional functional criteria and insurance authorization.  Follow Up Recommendations  Home health PT     Assistance Recommended at Discharge Frequent or constant Supervision/Assistance  Patient can return home with the following A little help with walking and/or transfers;A little help with bathing/dressing/bathroom;Assistance with cooking/housework;Assistance with feeding;Direct supervision/assist for medications management;Direct supervision/assist for financial management;Assist for transportation;Help with stairs or ramp for entrance   Equipment Recommendations  Rolling walker (2 wheels)    Recommendations for Other Services       Precautions / Restrictions Precautions Precautions: Fall Restrictions Weight Bearing Restrictions:  No     Mobility  Bed Mobility Overal bed mobility: Needs Assistance Bed Mobility: Supine to Sit     Supine to sit: HOB elevated, Supervision     General bed mobility comments: Supervision for safety, HOB elevated    Transfers Overall transfer level: Needs assistance Equipment used: Rolling walker (2 wheels) Transfers: Sit to/from Stand Sit to Stand: Min guard           General transfer comment: Cues to place hands on bed to push up to stand, but pt pulling up on RW. No LOB, min guard for safety    Ambulation/Gait Ambulation/Gait assistance: Min guard Gait Distance (Feet): 360 Feet Assistive device: Rolling walker (2 wheels) Gait Pattern/deviations: Step-through pattern, Decreased stride length, Narrow base of support Gait velocity: slowed Gait velocity interpretation: 1.31 - 2.62 ft/sec, indicative of limited community ambulator   General Gait Details: Pt with noted improved stability with no LOB, minguard for safety. Cues provided to widen BOS and ensure feet clearance when stepping. Repeated cues for upright posture.   Stairs Stairs: Yes Stairs assistance: Min guard Stair Management: One rail Right, One rail Left, Alternating pattern, Step to pattern, Forwards Number of Stairs: 10 General stair comments: Ascends with reciprocal pattern using L handrail, no LOB, min guard for safety. Descends with R handrail, cuing pt for step-to pattern as pt initially trying reciprocal pattern but displaying some instability, improved instability noted with step-to pattern, no LOB, min guard for safety. Educated pt and family on having someone guard him on stairs and how to do so safely. Educated them on managing the RW up/down the stairs for him if possible, and if it is unsafe for the family member to do so then to order another RW for 1 to stay downstairs and 1  to stay upstairs to avoid need for transporting it.   Wheelchair Mobility    Modified Rankin (Stroke Patients Only)        Balance Overall balance assessment: Needs assistance Sitting-balance support: No upper extremity supported, Feet supported Sitting balance-Leahy Scale: Fair     Standing balance support: Bilateral upper extremity supported, During functional activity Standing balance-Leahy Scale: Poor                              Cognition Arousal/Alertness: Awake/alert Behavior During Therapy: WFL for tasks assessed/performed Overall Cognitive Status: Difficult to assess                                 General Comments: Difficult to fully assess due to language barrier and HOH. Pt following cues appropriately though.        Exercises      General Comments General comments (skin integrity, edema, etc.): wife present and supportive; educated them on use of RW anytime pt is standing and use of gait belt; used video interpreter along with wife's assistance during session      Pertinent Vitals/Pain Pain Assessment Pain Assessment: Faces Faces Pain Scale: Hurts little more Pain Location: HA Pain Descriptors / Indicators: Discomfort, Headache Pain Intervention(s): Monitored during session, Limited activity within patient's tolerance    Home Living Family/patient expects to be discharged to:: Private residence Living Arrangements: Spouse/significant other;Children (daughter) Available Help at Discharge: Family;Available 24 hours/day Type of Home: House Home Access: Stairs to enter Entrance Stairs-Rails: None Entrance Stairs-Number of Steps: 1 Alternate Level Stairs-Number of Steps: flight Home Layout: Two level;Bed/bath upstairs Home Equipment: Shower seat;Cane - single point Additional Comments: information from daughter    Prior Function            PT Goals (current goals can now be found in the care plan section) Acute Rehab PT Goals Patient Stated Goal: return home PT Goal Formulation: With patient/family Time For Goal Achievement:  06/13/22 Potential to Achieve Goals: Good Progress towards PT goals: Progressing toward goals    Frequency    Min 4X/week      PT Plan Current plan remains appropriate    Co-evaluation              AM-PAC PT "6 Clicks" Mobility   Outcome Measure  Help needed turning from your back to your side while in a flat bed without using bedrails?: None Help needed moving from lying on your back to sitting on the side of a flat bed without using bedrails?: A Little Help needed moving to and from a bed to a chair (including a wheelchair)?: A Little Help needed standing up from a chair using your arms (e.g., wheelchair or bedside chair)?: A Little Help needed to walk in hospital room?: A Little Help needed climbing 3-5 steps with a railing? : A Little 6 Click Score: 19    End of Session Equipment Utilized During Treatment: Gait belt Activity Tolerance: Patient tolerated treatment well Patient left: in chair;with call bell/phone within reach;with family/visitor present;Other (comment) (with OT) Nurse Communication: Mobility status PT Visit Diagnosis: Other abnormalities of gait and mobility (R26.89);Repeated falls (R29.6);Muscle weakness (generalized) (M62.81);Unsteadiness on feet (R26.81)     Time: 9628-3662 PT Time Calculation (min) (ACUTE ONLY): 28 min  Charges:  $Gait Training: 23-37 mins  Moishe Spice, PT, DPT Acute Rehabilitation Services  Office: [phone]    Orvan Falconer 05/31/2022, 12:12 PM

## 2022-05-31 NOTE — Discharge Summary (Addendum)
Physician Discharge Summary  Patient ID: Danny Hudson MRN: [id number] DOB/AGE: 1927-12-08 88 y.o.  Admit date: 05/28/2022 Discharge date: 05/31/2022  Admission Diagnoses: Left SDH    Discharge Diagnoses: same   Discharged Condition: good  Hospital Course: The patient was admitted on 05/28/2022 and taken to the operating room where the patient underwent left burr hole. The patient tolerated the procedure well and was taken to the recovery room and then to the ICU in stable condition. The hospital course was routine. There were no complications. The wound remained clean dry and intact. Pt had appropriate head soreness. No complaints of new pain or new N/T/W. The patient remained afebrile with stable vital signs, and tolerated a regular diet. The patient continued to increase activities, and pain was well controlled with oral pain medications.   Consults: None  Significant Diagnostic Studies:  Results for orders placed or performed during the hospital encounter of 05/28/22  MRSA Next Gen by PCR, Nasal   Specimen: Nasal Mucosa; Nasal Swab  Result Value Ref Range   MRSA by PCR Next Gen NOT DETECTED NOT DETECTED  CBC with Differential  Result Value Ref Range   WBC 12.5 (H) 4.0 - 10.5 K/uL   RBC 4.04 (L) 4.22 - 5.81 MIL/uL   Hemoglobin 12.1 (L) 13.0 - 17.0 g/dL   HCT 36.6 (L) 39.0 - 52.0 %   MCV 90.6 80.0 - 100.0 fL   MCH 30.0 26.0 - 34.0 pg   MCHC 33.1 30.0 - 36.0 g/dL   RDW 13.8 11.5 - 15.5 %   Platelets 195 150 - 400 K/uL   nRBC 0.0 0.0 - 0.2 %   Neutrophils Relative % 75 %   Neutro Abs 9.3 (H) 1.7 - 7.7 K/uL   Lymphocytes Relative 11 %   Lymphs Abs 1.4 0.7 - 4.0 K/uL   Monocytes Relative 9 %   Monocytes Absolute 1.2 (H) 0.1 - 1.0 K/uL   Eosinophils Relative 5 %   Eosinophils Absolute 0.6 (H) 0.0 - 0.5 K/uL   Basophils Relative 0 %   Basophils Absolute 0.0 0.0 - 0.1 K/uL   Immature Granulocytes 0 %   Abs Immature Granulocytes 0.05 0.00 - 0.07 K/uL  Comprehensive metabolic  panel  Result Value Ref Range   Sodium 136 135 - 145 mmol/L   Potassium 3.6 3.5 - 5.1 mmol/L   Chloride 108 98 - 111 mmol/L   CO2 21 (L) 22 - 32 mmol/L   Glucose, Bld 196 (H) 70 - 99 mg/dL   BUN 17 8 - 23 mg/dL   Creatinine, Ser 1.12 0.61 - 1.24 mg/dL   Calcium 8.5 (L) 8.9 - 10.3 mg/dL   Total Protein 7.1 6.5 - 8.1 g/dL   Albumin 3.9 3.5 - 5.0 g/dL   AST 29 15 - 41 U/L   ALT 21 0 - 44 U/L   Alkaline Phosphatase 86 38 - 126 U/L   Total Bilirubin 0.8 0.3 - 1.2 mg/dL   GFR, Estimated >60 >60 mL/min   Anion gap 7 5 - 15  Urinalysis, Routine w reflex microscopic Urine, Clean Catch  Result Value Ref Range   Color, Urine STRAW (A) YELLOW   APPearance CLEAR CLEAR   Specific Gravity, Urine 1.009 1.005 - 1.030   pH 6.0 5.0 - 8.0   Glucose, UA NEGATIVE NEGATIVE mg/dL   Hgb urine dipstick NEGATIVE NEGATIVE   Bilirubin Urine NEGATIVE NEGATIVE   Ketones, ur NEGATIVE NEGATIVE mg/dL   Protein, ur NEGATIVE NEGATIVE mg/dL  Nitrite NEGATIVE NEGATIVE   Leukocytes,Ua NEGATIVE NEGATIVE  Protime-INR  Result Value Ref Range   Prothrombin Time 13.1 11.4 - 15.2 seconds   INR 1.0 0.8 - 1.2  CBG monitoring, ED  Result Value Ref Range   Glucose-Capillary 202 (H) 70 - 99 mg/dL  CBG monitoring, ED  Result Value Ref Range   Glucose-Capillary 107 (H) 70 - 99 mg/dL  Type and screen [place]  Result Value Ref Range   ABO/RH(D) B POS    Antibody Screen NEG    Sample Expiration      06/01/2022,2359 Performed at [facility] [address], [place], [place] [address]   ABO/Rh  Result Value Ref Range   ABO/RH(D)      B POS Performed at [facility] [address], [place], [place] [address]     CT HEAD WO CONTRAST  Result Date: 05/30/2022 CLINICAL DATA:  Subdural hematoma status post evacuation EXAM: CT HEAD WITHOUT CONTRAST TECHNIQUE: Contiguous axial images were obtained from the base of the skull through the vertex without intravenous contrast. RADIATION DOSE  REDUCTION: This exam was performed according to the departmental dose-optimization program which includes automated exposure control, adjustment of the mA and/or kV according to patient size and/or use of iterative reconstruction technique. COMPARISON:  05/28/2022 FINDINGS: Brain: Status post evacuation left convexity subdural hematoma. There is now a combination of pneumocephalus and low-density fluid in the left convexity extra-axial spaces. This measures 2.5 cm in thickness, compared to 3.1 cm previously. Small right convexity subdural hematoma is unchanged. Midline shift has decreased to 4 mm. Vascular: Atherosclerotic calcification of the vertebral and internal carotid arteries at the skull base. No abnormal hyperdensity of the major intracranial arteries or dural venous sinuses. Skull: Left parietal burr hole Sinuses/Orbits: Right maxillary sinus opacification. The orbits are normal. IMPRESSION: 1. Status post evacuation of left convexity subdural hematoma with now a combination of pneumocephalus and low-density fluid in the left convexity extra-axial spaces. 2. Decreased midline shift to 4 mm. 3. Unchanged small right convexity subdural hematoma. Electronically Signed   By: Ulyses Jarred M.D.   On: 05/30/2022 00:13   DG Chest Port 1 View  Result Date: 05/29/2022 CLINICAL DATA:  Weakness. EXAM: PORTABLE CHEST 1 VIEW COMPARISON:  February 23, 2022 FINDINGS: The cardiac silhouette is borderline in size. There is marked severity calcification of the aortic arch. Low lung volumes are noted with mild atelectasis seen within the left lung base. There is no evidence of a pleural effusion or pneumothorax. Multilevel degenerative changes are seen throughout the thoracic spine. IMPRESSION: Low lung volumes with mild left basilar atelectasis. Electronically Signed   By: Virgina Norfolk M.D.   On: 05/29/2022 01:01   CT HEAD WO CONTRAST (5MM)  Result Date: 05/29/2022 CLINICAL DATA:  Fall weakness EXAM: CT HEAD  WITHOUT CONTRAST TECHNIQUE: Contiguous axial images were obtained from the base of the skull through the vertex without intravenous contrast. RADIATION DOSE REDUCTION: This exam was performed according to the departmental dose-optimization program which includes automated exposure control, adjustment of the mA and/or kV according to patient size and/or use of iterative reconstruction technique. COMPARISON:  None Available. FINDINGS: Brain: Large isodense extra-axial left convexity hematoma, measures 3.6 cm maximum on axial images. Convex margins on coronal views but still favored to represent subdural hematoma. Right word midline shift up to 7 mm maximal on coronal images. Small right convexity subdural hematoma measuring 4 mm. Hematoma on the left exerts mass effect  on the underlying brain parenchyma. There is atrophy and chronic small vessel ischemic changes of the white matter. Probable chronic lacunar infarcts in the thalamus. Ventricles are nonenlarged Vascular: No hyperdense vessels.  Carotid vascular calcification Skull: Normal. Negative for fracture or focal lesion. Sinuses/Orbits: Moderate mucosal thickening in the sinuses Other: None IMPRESSION: 1. Large left convexity extra-axial/probable isodense subdural hematoma measuring up to 3.6 cm maximum with about 7 mm midline shift to the right and mass effect on the underlying brain parenchyma. Trace 4 mm right convexity acute subdural hematoma. 2. Atrophy and chronic small vessel ischemic changes of the white matter. Critical Value/emergent results were called by telephone at the time of interpretation on 05/29/2022 at 12:14 am to provider Montine Circle , who verbally acknowledged these results. Electronically Signed   By: Donavan Foil M.D.   On: 05/29/2022 00:14    Antibiotics:  Anti-infectives (From admission, onward)    Start     Dose/Rate Route Frequency Ordered Stop   05/29/22 1033  ceFAZolin (ANCEF) IVPB 2g/100 mL premix        2 g 200 mL/hr  over 30 Minutes Intravenous 30 min pre-op 05/29/22 1033 05/29/22 1846       Discharge Exam: Blood pressure (!) 146/69, pulse 91, temperature 98.2 F (36.8 C), temperature source Axillary, resp. rate (!) 27, SpO2 97 %. Neurologic: Grossly normal Ambulating and voiding well incision cdi   Discharge Medications:   Allergies as of 05/31/2022   No Known Allergies      Medication List     TAKE these medications    amLODipine-valsartan 5-160 MG tablet Commonly known as: EXFORGE TAKE 1 TABLET BY MOUTH DAILY   aspirin EC 81 MG tablet Take 81 mg by mouth at bedtime.   atorvastatin 80 MG tablet Commonly known as: LIPITOR Take 1 tablet (80 mg total) by mouth daily. NEED OV. What changed:  when to take this additional instructions   vitamin C 1000 MG tablet Take 500 mg by mouth See admin instructions. 500 mg in the morning  500 mg at noon   Vitamin D3 50 MCG (2000 UT) capsule Take 2,000 Units by mouth daily at 12 noon.               Durable Medical Equipment  (From admission, onward)           Start     Ordered   05/31/22 1239  For home use only DME Walker rolling  Once       Question Answer Comment  Walker: With [place] Wheels   Patient needs a walker to treat with the following condition Decreased functional mobility and endurance      05/31/22 1239            Disposition: home   Final Dx: left burr hole for SDH  Discharge Instructions     Diet - low sodium heart healthy   Complete by: As directed    Face-to-face encounter (required for Medicare/Medicaid patients)   Complete by: As directed    I Eleonore Chiquito certify that this patient is under my care and that I, or a nurse practitioner or physician's assistant working with me, had a face-to-face encounter that meets the physician face-to-face encounter requirements with this patient on 05/31/2022. The encounter with the patient was in whole, or in part for the following medical  condition(s) which is the primary reason for home health care (List medical condition): postop crani   The encounter with the  patient was in whole, or in part, for the following medical condition, which is the primary reason for home health care: s/p craniotomy   I certify that, based on my findings, the following services are medically necessary home health services: Physical therapy   Reason for Medically Necessary Home Health Services: Therapy- Personnel officer, Public librarian   My clinical findings support the need for the above services: Unable to leave home safely without assistance and/or assistive device   Further, I certify that my clinical findings support that this patient is homebound due to: Unable to leave home safely without assistance   Home Health   Complete by: As directed    To provide the following care/treatments:  PT OT     Increase activity slowly   Complete by: As directed    No wound care   Complete by: As directed           Signed: Ocie Cornfield Shaiann Mcmanamon 05/31/2022, 1:25 PM

## 2022-05-31 NOTE — TOC Initial Note (Addendum)
Transition of Care [facility]) - Initial/Assessment Note    Patient Details  Name: Danny Hudson MRN: [id number] Date of Birth: July 11, 1928  [facility] CM/SW Contact:    Bartholomew Crews, RN Phone Number: [phone] 05/31/2022, 1:00 PM  Clinical Narrative:                  Notified by PT of patient's need for RW. PT using iPad for language communication assistance stated that patient was agreeable to RW. Referral to [facility] for delivery to room. Noted HH PT/OT recommendations and HH orders in place. - referral accepted by [facility].   UPDATE: Spoke with patient's daughter, [name], on her cell phone. Confirmed that patient received RW prior to leaving. She asked about patient getting rollator - advised that PT recommendations were for RW d/t patient having some balance issues and rollator was more for patient's with endurance issues. Discussed options of getting transport chair for when taking patient out. Advised of [facility] PT/OT referral to [facility] - she is agreeable.   Expected Discharge Plan: [place] Barriers to Discharge: Continued Medical Work up   Patient Goals and CMS Choice   CMS Medicare.gov Compare Post Acute Care list provided to:: Patient Choice offered to / list presented to : Patient  Expected Discharge Plan and Services Expected Discharge Plan: [place]   Discharge Planning Services: CM Consult Post Acute Care Choice: Durable Medical Equipment, Home Health                   DME Arranged: Walker rolling DME Agency: [facility] Date DME Agency Contacted: 05/31/22 Time DME Agency Contacted: [id number] Representative spoke with at DME Agency: Mardene Celeste HH Arranged: OT, PT [place] Agency: [facility]) Date [place]: 05/31/22 Time [place]: 90 Representative spoke with at [place]: [place] Arrangements/Services                    Criminal Activity/Legal  Involvement Pertinent to Current Situation/Hospitalization: No - Comment as needed  Activities of Daily Living      Permission Sought/Granted                  Emotional Assessment              Admission diagnosis:  Subdural hematoma ([place]) [S06.5XAA] Patient Active Problem List   Diagnosis Date Noted   Subdural hematoma ([place]) 05/29/2022   PAF (paroxysmal atrial fibrillation) ([place]) 03/22/2022   Community acquired pneumonia 02/23/2022   Hypoxemia 08/02/2020   Lower extremity edema 05/04/2020   Claudication in peripheral vascular disease ([place]) 08/15/2019   Peripheral arterial disease ([place]) 07/22/2019   Nonrheumatic mitral valve regurgitation 05/19/2019   RBBB 05/19/2019   Murmur, cardiac 07/10/2015   Gout of ankle 03/21/2015   Pain in joint, ankle and foot 03/11/2013   Edema 03/11/2013   Metatarsal deformity 03/11/2013   RUQ PAIN 10/16/2009   HEMORRHOIDS, INTERNAL W/O COMPLICATION 24/23/5361   Essential hypertension 01/21/2007   ALLERGIC RHINITIS 01/21/2007   DIZZINESS 01/21/2007   PCP:  Jilda Panda, MD Pharmacy:   [facility], [place] [place] [address] Phone: [phone] Fax: [phone]  [facility] ([place], [place] [facility] [address] [place] [address] [place] [address] Phone: [phone] Fax: [phone]     Social  Determinants of Health (SDOH) Interventions    Readmission Risk Interventions     No data to display

## 2022-06-01 ENCOUNTER — Encounter: Payer: Self-pay | Admitting: Neurological Surgery

## 2023-06-11 IMAGING — CR DG CHEST 2V
2 series · 2 of 2 positions shown · non-contrast
Comparison: 05/20/2019

CLINICAL DATA: Reason for exam: Chest pain, SOB, Dizziness Per
triage notes: Pt/translator stated, daughter, they arrived at church
AND EMS stated chest pain and SOB

EXAM:
CHEST - 2 VIEW

[chest lat]
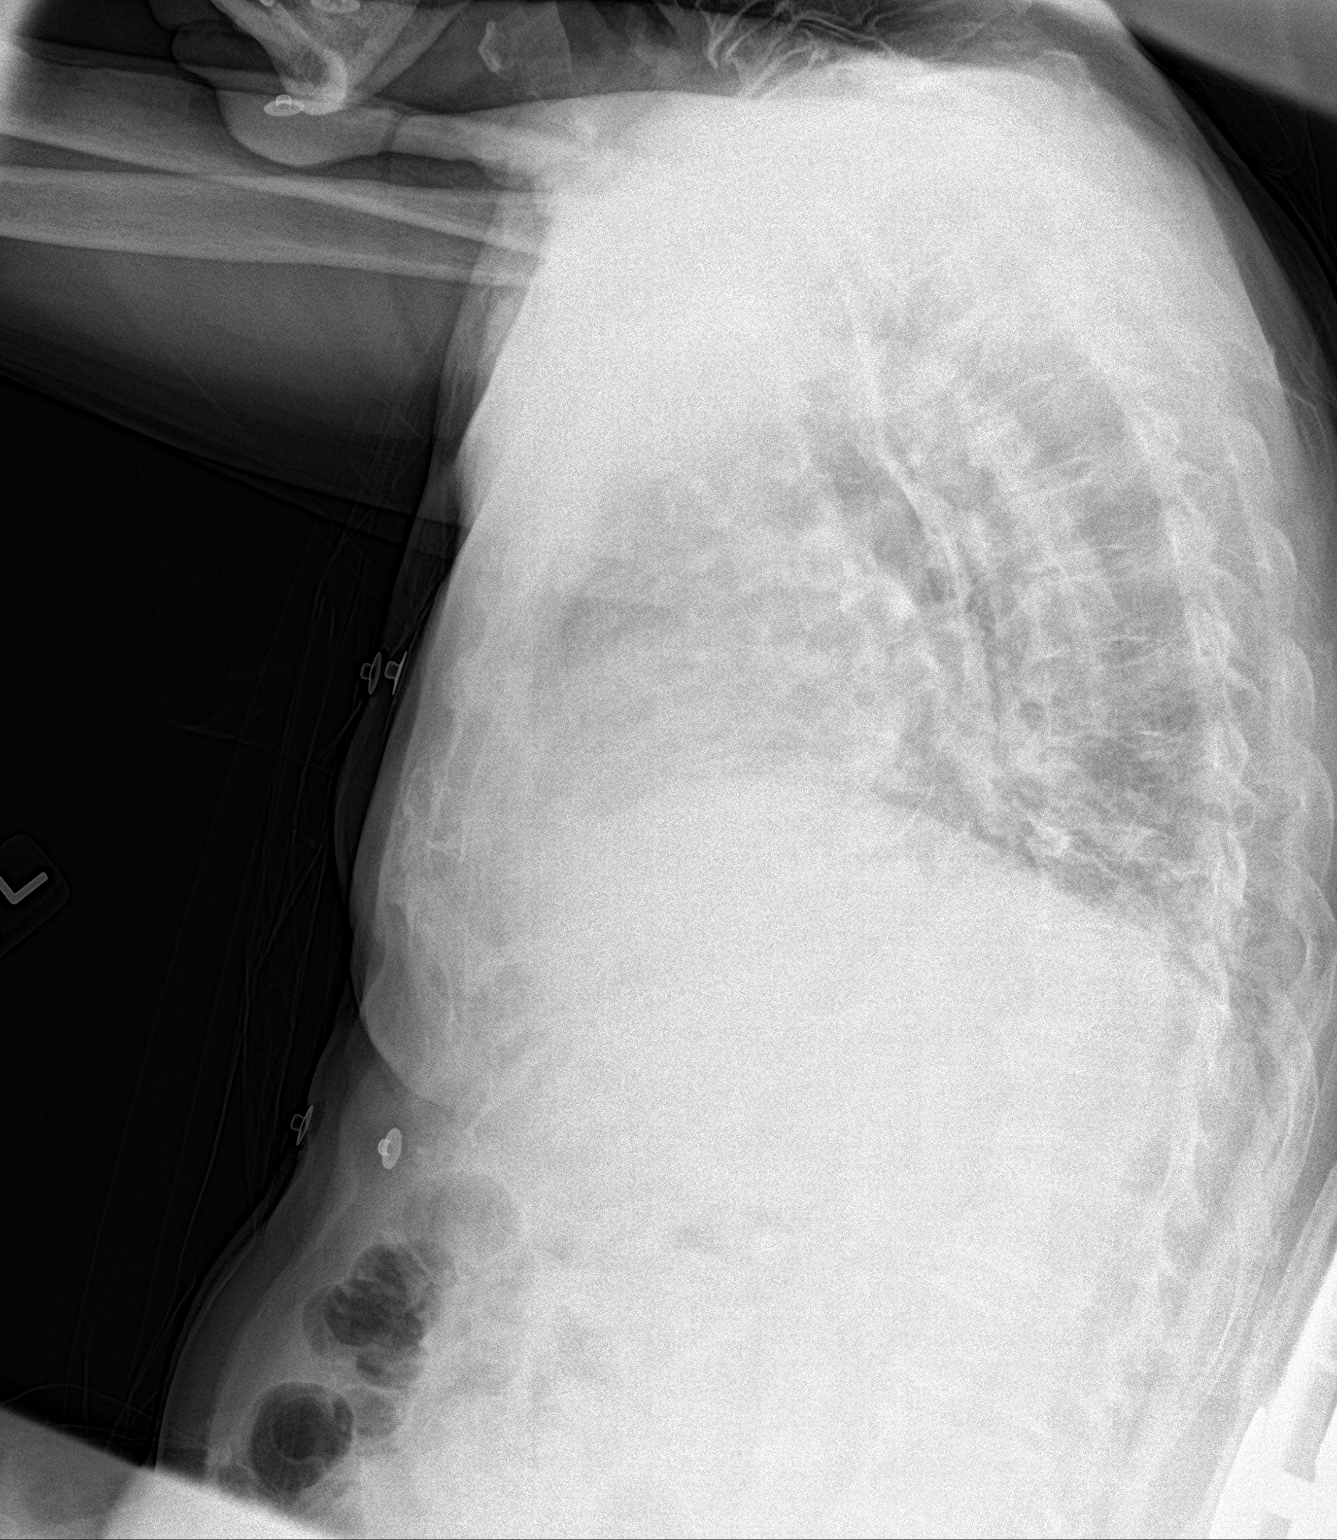

[chest ap]
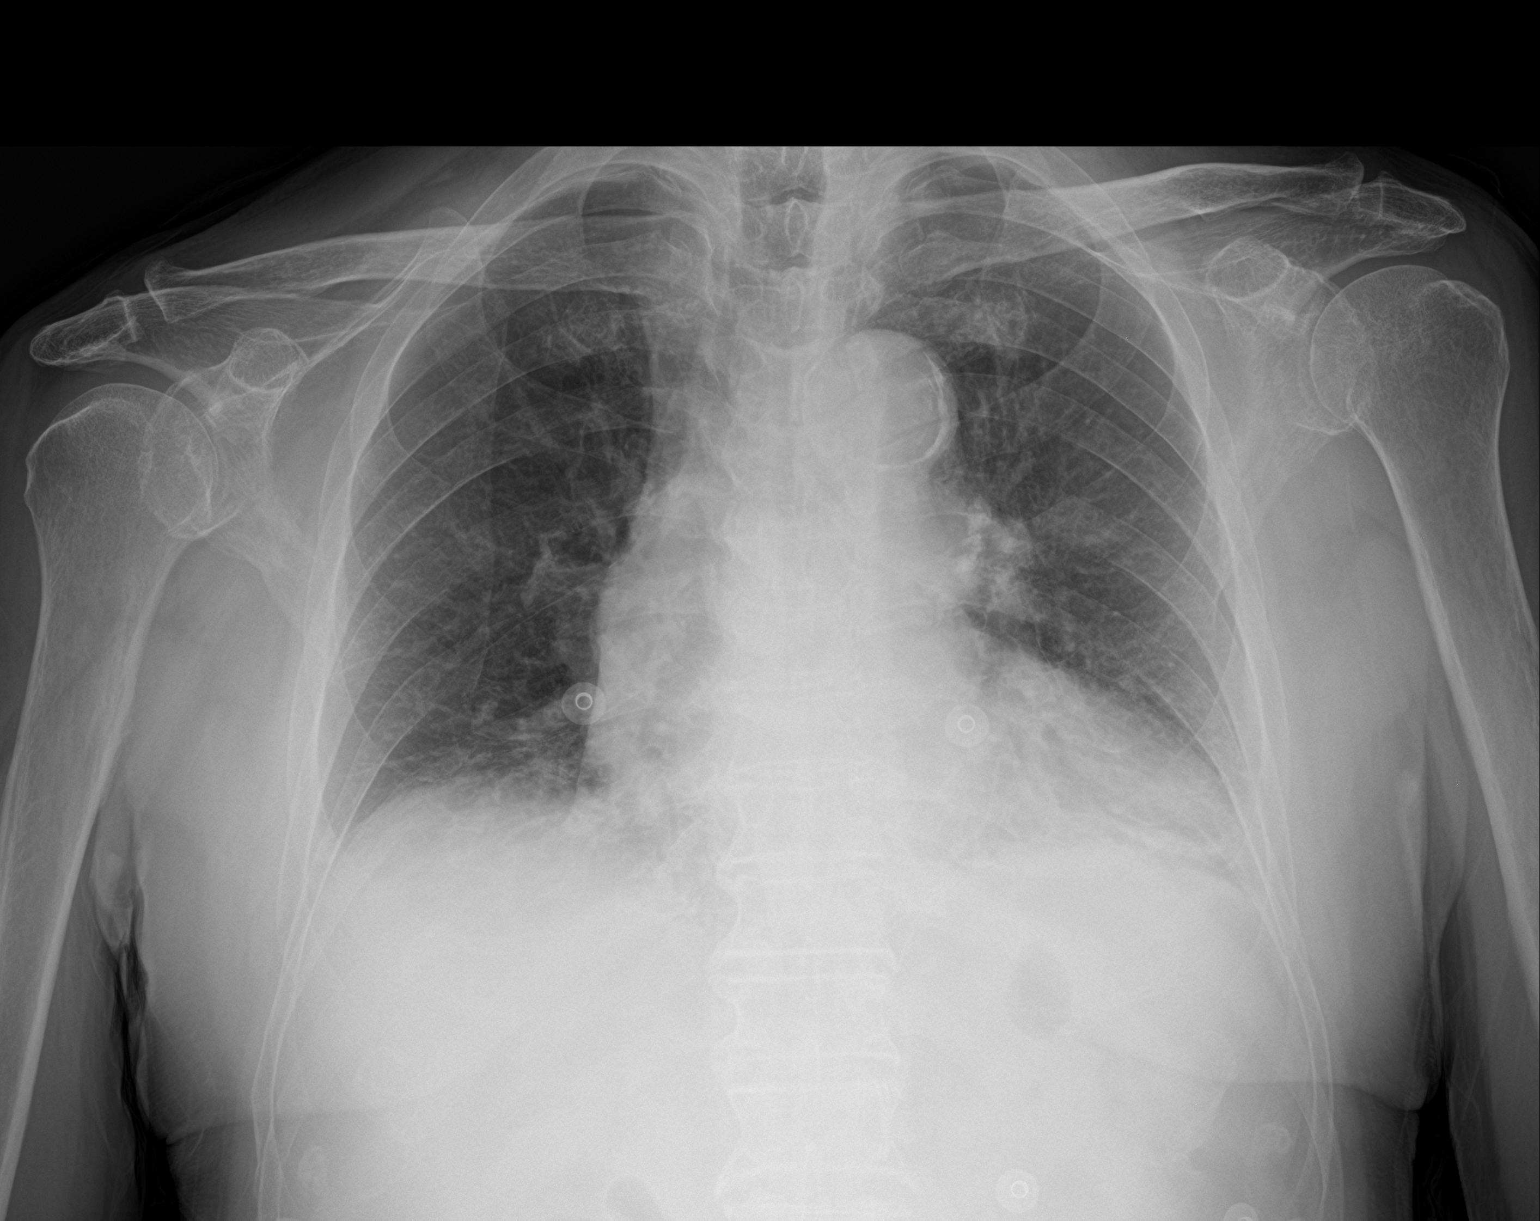

[2 of 2 positions shown; findings below may reference images not displayed]

FINDINGS: Relatively low lung volumes. New bibasilar atelectasis or
infiltrate, left greater than right. No definite pleural effusion on
the lateral radiograph.

Heart size upper limits normal. Aortic Atherosclerosis
(1ZCEC-170.0).

No pneumothorax.

Visualized bones unremarkable.
IMPRESSION: Low volumes with bibasilar atelectasis or infiltrates.

Aortic Atherosclerosis (1ZCEC-170.0).

## 2023-06-12 IMAGING — CT CT ANGIO CHEST
3 of 7 series · 18 of 36 positions shown · IV contrast (agent unspecified)
Comparison: None Available.

CLINICAL DATA: Pulmonary embolism (PE) suspected, positive D-dimer.

EXAM:
CT ANGIOGRAPHY CHEST WITH CONTRAST
TECHNIQUE: Multidetector CT imaging of the chest was performed using the
standard protocol during bolus administration of intravenous
contrast. Multiplanar CT image reconstructions and MIPs were
obtained to evaluate the vascular anatomy.

[Series 6: pe thins · axial · 0.76mm/px · z∈[+1037,+1265]mm · 13 of 267 slices shown]
[im 20/267  lung]
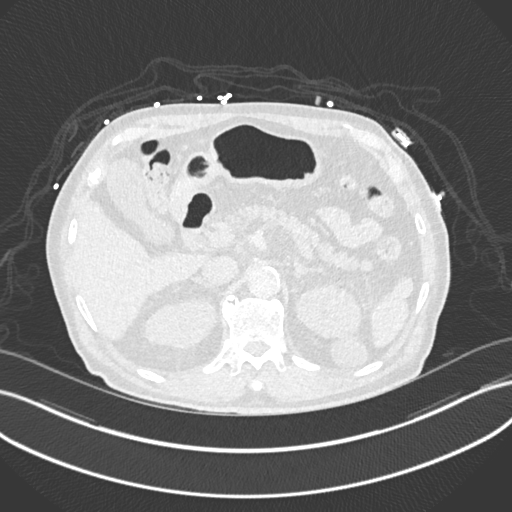
[im 39/267  mediastinal]
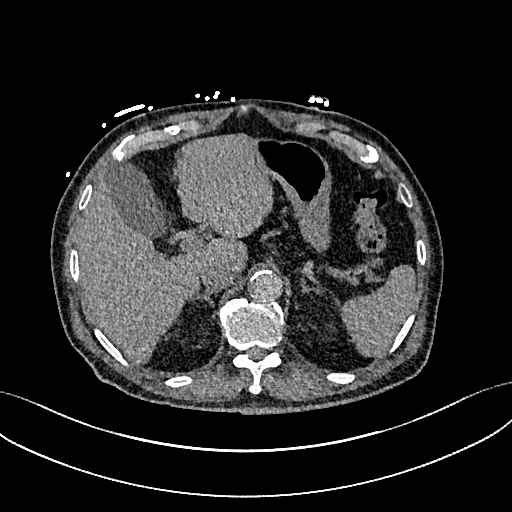
[im 58/267  lung]
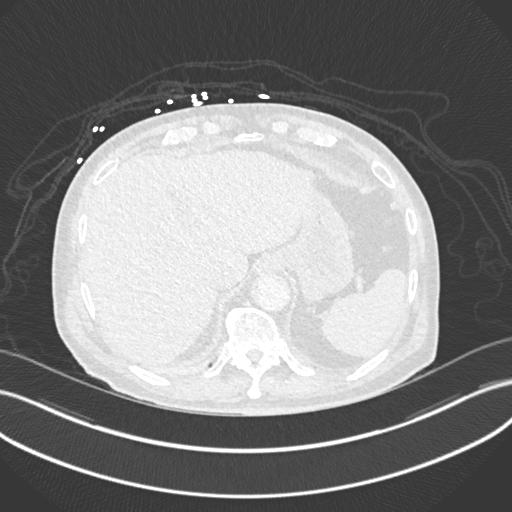
[im 77/267  mediastinal]
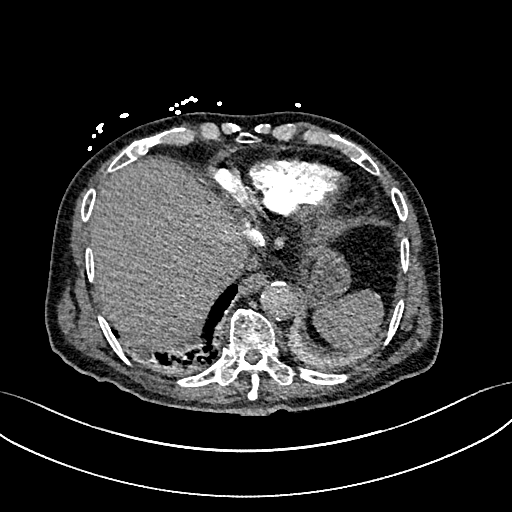
[im 96/267  lung]
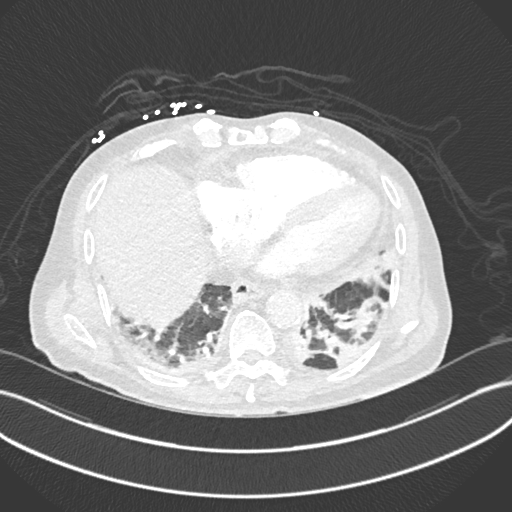
[im 115/267  mediastinal]
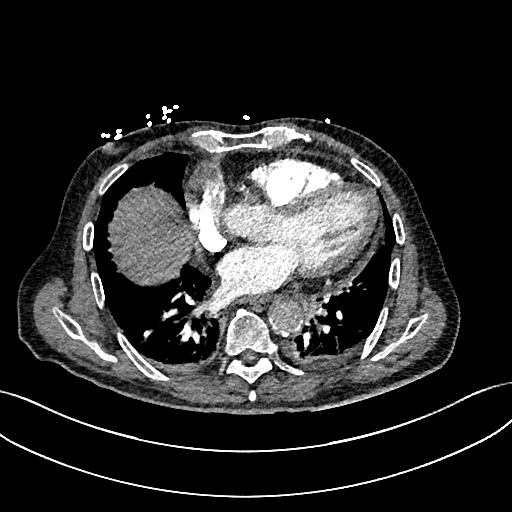
[im 134/267  lung]
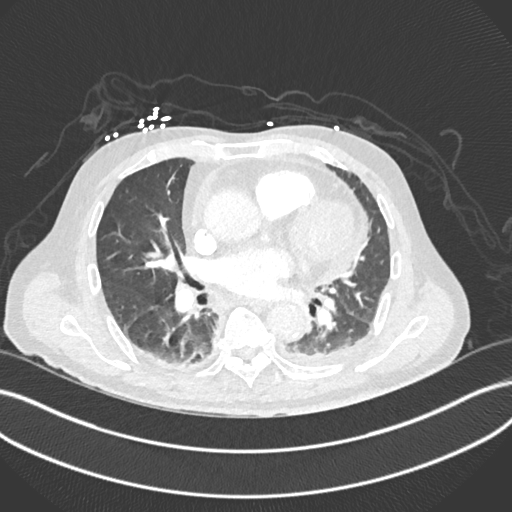
[im 153/267  mediastinal]
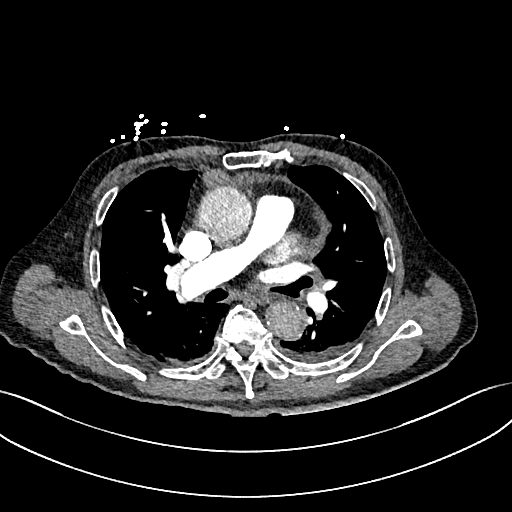
[im 172/267  lung]
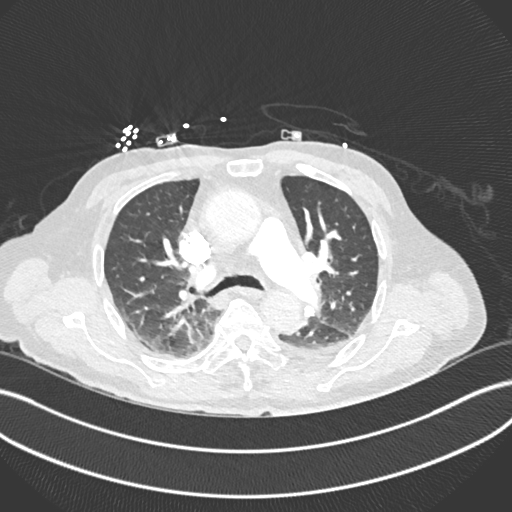
[im 191/267  mediastinal]
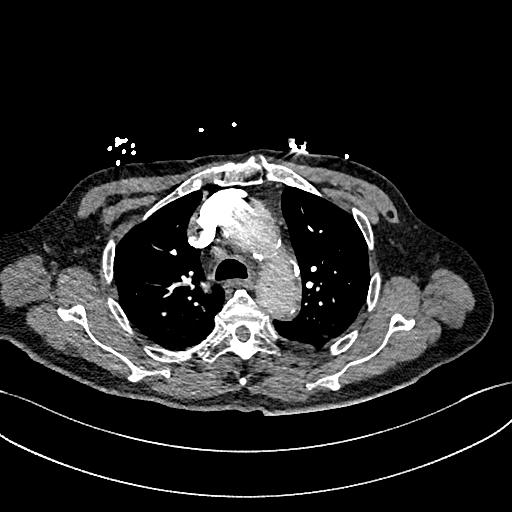
[im 210/267  lung]
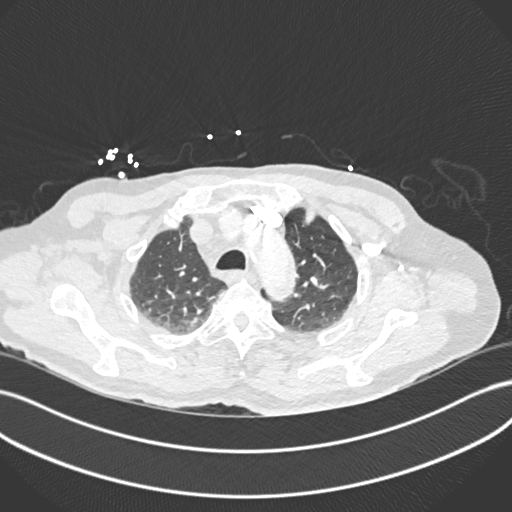
[im 229/267  mediastinal]
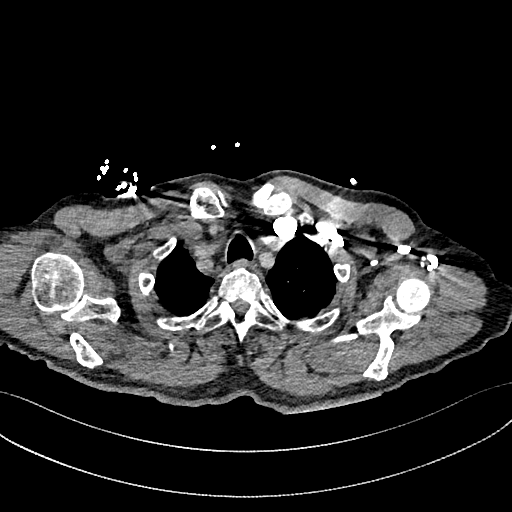
[im 248/267  lung]
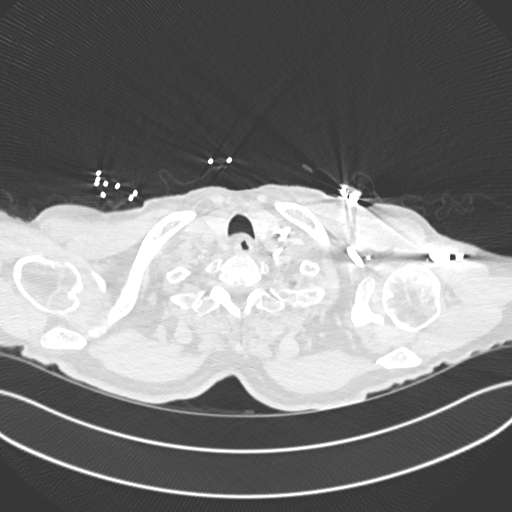

[Series 7: pe lung · axial · 0.76mm/px · z∈[+1108,+1240]mm · 4 of 111 slices shown]
[im 23/111  mediastinal]
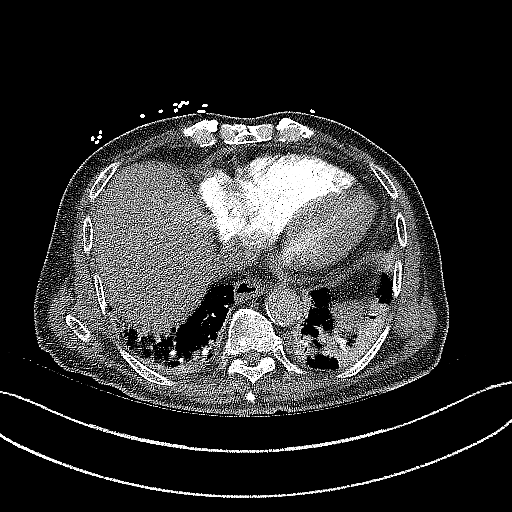
[im 45/111  mediastinal]
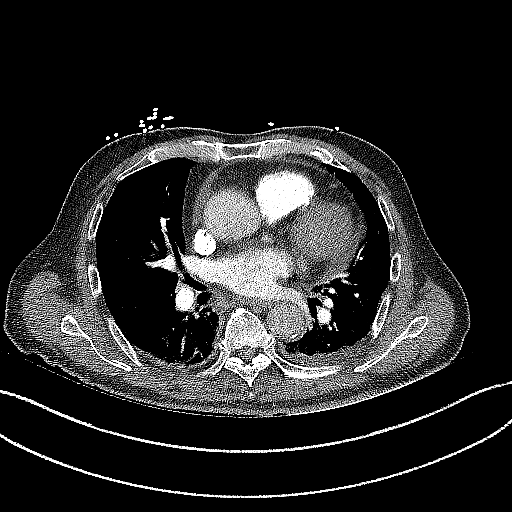
[im 67/111  mediastinal]
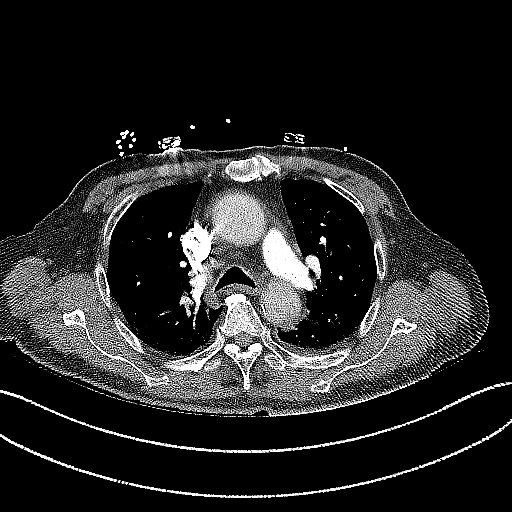
[im 89/111  mediastinal]
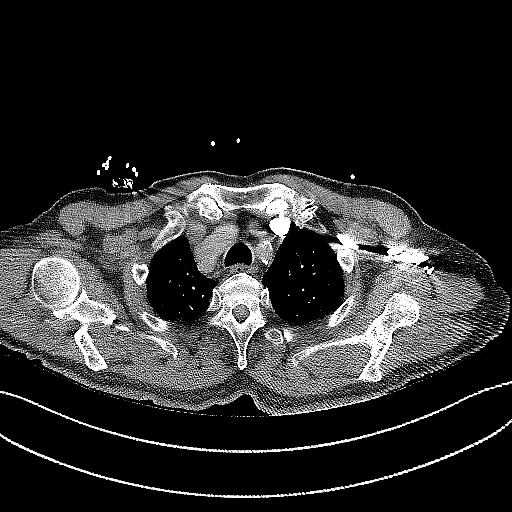

[Series 8: pe 2mm cor · coronal · 0.55mm/px · 1 of 130 slices shown]
[im 65/130  mediastinal]
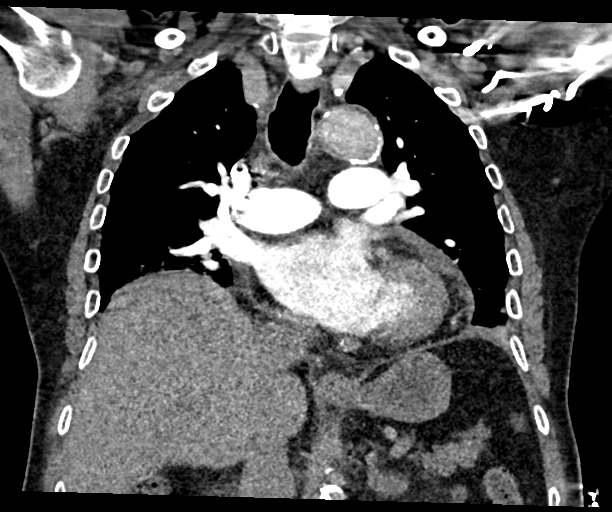

[18 of 36 positions shown; findings below may reference images not displayed]

RADIATION DOSE REDUCTION: This exam was performed according to the
departmental dose-optimization program which includes automated
exposure control, adjustment of the mA and/or kV according to
patient size and/or use of iterative reconstruction technique.

CONTRAST:  80mL OMNIPAQUE IOHEXOL 350 MG/ML SOLN
FINDINGS: Cardiovascular: No filling defects in the pulmonary arteries to
suggest pulmonary emboli. Cardiomegaly. Coronary artery and aortic
calcifications. No evidence of aortic aneurysm.

Mediastinum/Nodes: No mediastinal, hilar, or axillary adenopathy.
Trachea and esophagus are unremarkable. Thyroid unremarkable.

Lungs/Pleura: Bibasilar airspace opacities, favor atelectasis
although pneumonia is not excluded. No effusions.

Upper Abdomen: No acute findings

Musculoskeletal: Chest wall soft tissues are unremarkable. No acute
bony abnormality.

Review of the MIP images confirms the above findings.
IMPRESSION: No evidence of pulmonary embolus.

Cardiomegaly, coronary artery disease.

Bibasilar atelectasis or infiltrates/pneumonia.

Aortic Atherosclerosis (HLZ32-LT3.3).

## 2023-09-10 ENCOUNTER — Encounter (INDEPENDENT_AMBULATORY_CARE_PROVIDER_SITE_OTHER): Payer: Self-pay

## 2023-09-10 ENCOUNTER — Ambulatory Visit (INDEPENDENT_AMBULATORY_CARE_PROVIDER_SITE_OTHER): Payer: Medicare Other | Admitting: Otolaryngology

## 2023-09-10 VITALS — Wt 126.0 lb

## 2023-09-10 DIAGNOSIS — J3089 Other allergic rhinitis: Secondary | ICD-10-CM | POA: Diagnosis not present

## 2023-09-10 DIAGNOSIS — R040 Epistaxis: Secondary | ICD-10-CM | POA: Diagnosis not present

## 2023-09-10 MED ORDER — MUPIROCIN 2 % EX OINT: 1.0000 | TOPICAL_OINTMENT | Freq: Two times a day (BID) | CUTANEOUS | 0 refills | Status: AC

## 2023-09-10 NOTE — Progress Notes (Signed)
Dear Dr. Ludwig Clarks, Here is my assessment for our mutual patient, Danny Hudson. Thank you for allowing me the opportunity to care for your patient. Please do not hesitate to contact me should you have any other questions. Sincerely, Dr. Jovita Kussmaul  Otolaryngology Clinic Note Referring provider: Dr. Ludwig Clarks HPI:  Danny Hudson is a 89 y.o. male kindly referred by Dr. Ludwig Clarks for evaluation of right epistaxis.   Daughter augments history. Reports that he has allergies, and that he has congestion with sneezing due to Allergies. He has typical AR symptoms and had a flare over past few months with frequent nose blowing. Over past month or so, however, has been having daily epistaxis for which they are being evaluated  Epistaxis history: bleeding every day, right side. Low volume, lasts for about 5 minutes. Stops on its own generally, sometimes puts tissue, no significant interventions. No ED visits HTN: no Frequency: daily Side: right CKD/Liver dysfunction: no Anticoagulation/AP: ASA 81 - stopped because of epistaxis Trauma: no History of Sinusitis: no, just has rhinorrhea during allergy season; otherwise no pressure/pain, other symptoms requiring antibiotics or steroids Nasal obstruction: no Nasal procedures: no Current nasal medication use: none  PMHx: Subdural hematoma s/p Rx 05/2022, HTN, RBBB, PAF, MVR, PVD s/p stenting  H&N Surgery: subdural hematoma evacuation Personal or FHx of bleeding dz or anesthesia difficulty: no  GLP-1: no AP/AC: ASA 81  Tobacco: quit several years ago. Lives in [place], [place]  Independent Review of Additional Tests or Records:  Dr. Jearld Fenton ENT notes (2018): Epistaxis, right sided, no other nasal symptoms. Area of visible vessels right septum. Rx: observation and humidification [facility] (ENT 2021): Sore throat x1 week, former smoker, no other sx; TFL negative; Rx: augmentin, supportive care Dr. Ludwig Clarks referral notes (07/2023): right epistaxis, regular; low  volume; Rx: humidification, Ref to ENT [facility] 05/2022: independent review shows right max opacification, scattered b/l ethmoid opacification, otherwise no significant pathology noted sinonasally Labs: CBC 05/2022: Hgb 12, Plt 195; CMP: Cr 1.1, LFTs not significantly elevated PMH/Meds/All/SocHx/FamHx/ROS:   Past Medical History:  Diagnosis Date   ALLERGIC RHINITIS 01/21/2007   Qualifier: Diagnosis of  By: Drue Novel MD, Jose E.    Gout of ankle 03/21/2015   HEMORRHOIDS, INTERNAL W/O COMPLICATION 05/14/2007   Qualifier: Diagnosis of  By: Drue Novel MD, Nolon Rod.    HYPERTENSION 01/21/2007   Qualifier: Diagnosis of  By: Drue Novel MD, Nolon Rod.      Past Surgical History:  Procedure Laterality Date   ABDOMINAL AORTOGRAM W/LOWER EXTREMITY Bilateral 08/15/2019   Procedure: ABDOMINAL AORTOGRAM W/LOWER EXTREMITY;  Surgeon: Runell Gess, MD;  [facility];  Service: Cardiovascular;  Laterality: Bilateral;   ABDOMINAL AORTOGRAM W/LOWER EXTREMITY N/A 11/14/2019   Procedure: ABDOMINAL AORTOGRAM W/LOWER EXTREMITY;  Surgeon: Runell Gess, MD;  [facility]: [facility];  Service: Cardiovascular;  Laterality: N/A;   BURR HOLE Left 05/29/2022   Procedure: LEFT BURR HOLE FOR SUBDURAL HEMATOMA;  Surgeon: Jadene Pierini, MD;  Location: [facility] OR;  Service: Neurosurgery;  Laterality: Left;   CATARACT EXTRACTION     PERIPHERAL VASCULAR INTERVENTION Right 11/14/2019   Procedure: PERIPHERAL VASCULAR INTERVENTION;  Surgeon: Runell Gess, MD;  [facility]: [facility];  Service: Cardiovascular;  Laterality: Right;    Family History  Problem Relation Age of Onset   Sudden death Father 75     Social Connections: Not on file      Current Outpatient Medications:    amLODipine-valsartan (EXFORGE) 5-160 MG tablet, TAKE  1 TABLET BY MOUTH DAILY (Patient taking differently: Take 1 tablet by mouth daily.), Disp: 90 tablet, Rfl: 0   atorvastatin (LIPITOR) 80 MG tablet, Take 1 tablet (80 mg total) by mouth daily.  NEED OV. (Patient taking differently: Take 80 mg by mouth every evening.), Disp: 90 tablet, Rfl: 0   mupirocin ointment (BACTROBAN) 2 %, Apply 1 Application topically 2 (two) times daily. Use it in right nose twice per day, Disp: 22 g, Rfl: 0   Ascorbic Acid (VITAMIN C) 1000 MG tablet, Take 500 mg by mouth See admin instructions. 500 mg in the morning  500 mg at noon (Patient not taking: Reported on 09/10/2023), Disp: , Rfl:    aspirin EC 81 MG tablet, Take 81 mg by mouth at bedtime. (Patient not taking: Reported on 09/10/2023), Disp: , Rfl:    Cholecalciferol (VITAMIN D3) 50 MCG (2000 UT) capsule, Take 2,000 Units by mouth daily at 12 noon. (Patient not taking: Reported on 09/10/2023), Disp: , Rfl:    Physical Exam:   Wt 126 lb (57.2 kg)   BMI 23.05 kg/m   Salient findings:  CN II-XII intact  Bilateral EAC clear and TM intact with well pneumatized middle ear spaces Anterior rhinoscopy: septum deviates right, unable to visualize any prominent septal vessels. As such, nasal endoscopy was indicated for better evaluation of nose and paranasal sinuses and is documented below. After endoscopy, noted septum dev right; prominent vessels right mid-septum and head of inferior turbinate noted after endoscopy No lesions of oral cavity/oropharynx No obviously palpable neck masses/lymphadenopathy/thyromegaly No respiratory distress or stridor  Seprately Identifiable Procedures:  PROCEDURE: Bilateral Rigid Nasal Endoscopy with endoscopic control of RIGHT sided epistaxis (CPT [phone]) Pre-procedure diagnosis: Epistaxis - right Post-procedure diagnosis: same Indication: See pre-procedure diagnosis and physical exam above Complications: None apparent EBL: 0 mL Anesthesia: Lidocaine 4% and topical decongestant was topically sprayed in each nasal cavity   Description of Procedure:  Patient was identified as correct patient.  Afrin/lidocaine mix was sprayed into the nose in both nasal cavities. There was no  bleeding from posterior oropharynx. The decongestant was allowed to work. A headlight and speculum were used but unable to identify any potential source of epistaxis. Therefore, nasal endoscopy was necessary to evaluate and control the source of epistaxis. A rigid 30 degree endoscope was utilized to evaluate the sinonasal cavities, mucosa, sinus ostia and turbinates and septum bilaterally. On the left, MM and SE recess were clear and there were no noted prominent vessels on septum or inferior turbinate. On the contralateral side, there was an area over mid-septum and just beyond head of inferior turbinate which had a tuft of vessels which was identified as most likely source of bleeding. Otherwise, MM and SE recess were clear and no other prominent vessels were noted.  After discussion of R/B/A, decision was made to perform cauterization after consent. The areas of bleeding over septum and non-overlapping inferior turbinate were endoscopically visualized and then spot cauterized using silver nitrate cautery. Patient was observed and no further bleeding was seen. Mupirocin ointment was applied to both nares. Patient tolerated the procedure well.    Impression & Plans:  Danny Hudson is a 89 y.o. male with:  1. Epistaxis   2. Non-seasonal allergic rhinitis, unspecified trigger   Right sided, low volume. Has been ongoing since at least 2018 with now recurrence. It is bothersome. Endoscopy was able to identify right prominent septal vessels and another source over the inferior turbinate which was most likely source of  bleeding. These were cauterized after discussion of options and R/B/A.  - Start mupirocin ointment BID x7d - No nasal instrumentation or tissues - Humidification measures discussed - Avoid nose blowing x1 week - Afrin PRN during active bleeding - f/u in 6 weeks - Can restart ASA (stopped due to bleeding) in 5-7 days. Daughter reports they may not opt to. - Does not have frequent sinus  infections so will hold off on repeat CT given prior right max opacification  See below regarding exact medications prescribed this encounter including dosages and route: Meds ordered this encounter  Medications   mupirocin ointment (BACTROBAN) 2 %    Sig: Apply 1 Application topically 2 (two) times daily. Use it in right nose twice per day    Dispense:  22 g    Refill:  0      Thank you for allowing me the opportunity to care for your patient. Please do not hesitate to contact me should you have any other questions.  Sincerely, Jovita Kussmaul, MD Otolarynoglogist (ENT), [facility] Phone: [phone] Fax: [phone]  09/10/2023, 10:19 AM   MDM:  Level 4 Complexity/Problems addressed: chronic problem - noted since at least 2018, with exacerbation Data complexity: mod - independent review of CT imaging; notes, labs, results also reviewed - Morbidity: mod  - Prescription Drug prescribed or managed: yes

## 2023-09-10 NOTE — Patient Instructions (Signed)
Put mupirocin ointment in the nose twice daily for one week Avoid putting tissues or fingers in the nose Use humidifier at home Avoid blowing nose for 1 week If have bleeding, lean forward and pinch soft part of your nose for 10 minutes.

## 2023-10-22 ENCOUNTER — Ambulatory Visit (INDEPENDENT_AMBULATORY_CARE_PROVIDER_SITE_OTHER): Payer: Medicare Other

## 2024-07-08 ENCOUNTER — Telehealth (INDEPENDENT_AMBULATORY_CARE_PROVIDER_SITE_OTHER): Payer: Self-pay | Admitting: Otolaryngology

## 2024-07-08 NOTE — Telephone Encounter (Signed)
 07/08/24 Interpreter Id: [id number]- Called current patient regarding a referral for a new problem (hearing loss) Left the office call back number to schedule.

## 2024-07-11 ENCOUNTER — Encounter (INDEPENDENT_AMBULATORY_CARE_PROVIDER_SITE_OTHER): Payer: Self-pay

## 2024-07-11 ENCOUNTER — Telehealth (INDEPENDENT_AMBULATORY_CARE_PROVIDER_SITE_OTHER): Payer: Self-pay | Admitting: Otolaryngology

## 2024-07-11 NOTE — Telephone Encounter (Signed)
 Left a message with the patient to call back to reschedule her appointment for tomorrow at 4:15 with Dr Tobie through an interpreter [phone]). Dr Tobie is doing an emergency surgery tomorrow afternoon. I requested a call back to get the patient rescheduled.

## 2024-07-12 ENCOUNTER — Ambulatory Visit (INDEPENDENT_AMBULATORY_CARE_PROVIDER_SITE_OTHER): Admitting: Otolaryngology

## 2024-07-12 ENCOUNTER — Telehealth (INDEPENDENT_AMBULATORY_CARE_PROVIDER_SITE_OTHER): Payer: Self-pay | Admitting: Otolaryngology

## 2024-07-12 NOTE — Telephone Encounter (Signed)
 Left vm to r/s 07/12/24. Pt left vm to r/s

## 2024-07-15 ENCOUNTER — Encounter (INDEPENDENT_AMBULATORY_CARE_PROVIDER_SITE_OTHER): Payer: Self-pay | Admitting: Otolaryngology

## 2024-07-15 ENCOUNTER — Ambulatory Visit (INDEPENDENT_AMBULATORY_CARE_PROVIDER_SITE_OTHER): Admitting: Otolaryngology

## 2024-07-15 VITALS — BP 171/78 | HR 72 | Ht 62.0 in | Wt 120.0 lb

## 2024-07-15 DIAGNOSIS — R040 Epistaxis: Secondary | ICD-10-CM

## 2024-07-15 DIAGNOSIS — H918X3 Other specified hearing loss, bilateral: Secondary | ICD-10-CM | POA: Diagnosis not present

## 2024-07-18 ENCOUNTER — Telehealth (INDEPENDENT_AMBULATORY_CARE_PROVIDER_SITE_OTHER): Payer: Self-pay

## 2024-07-18 NOTE — Telephone Encounter (Signed)
 Patient's daughter called and LVM regarding patients hearing aid test results. Called daughter back. Per daughter, hearing aid facility attempted to test patient. Patient could not hear the beeps for the test so they gave up which is why they sent him to ENT. Patient was just seen Friday 10/31. Please advise? I seen no note entry from visit.

## 2024-07-30 NOTE — Progress Notes (Signed)
 Dear Dr. Valma, Here is my assessment for our mutual patient, Danny Hudson. Thank you for allowing me the opportunity to care for your patient. Please do not hesitate to contact me should you have any other questions. Sincerely, Dr. Eldora Blanch  Otolaryngology Clinic Note Referring provider: Dr. Valma HPI:  Danny Hudson is a 89 y.o. male kindly referred by Dr. Valma for evaluation of right epistaxis.   Initial visit (08/2024): Daughter augments history. Reports that he has allergies, and that he has congestion with sneezing due to Allergies. He has typical AR symptoms and had a flare over past few months with frequent nose blowing. Over past month or so, however, has been having daily epistaxis for which they are being evaluated  Epistaxis history: bleeding every day, right side. Low volume, lasts for about 5 minutes. Stops on its own generally, sometimes puts tissue, no significant interventions. No ED visits HTN: no Frequency: daily Side: right CKD/Liver dysfunction: no Anticoagulation/AP: ASA 81 - stopped because of epistaxis Trauma: no History of Sinusitis: no, just has rhinorrhea during allergy season; otherwise no pressure/pain, other symptoms requiring antibiotics or steroids Nasal obstruction: no Nasal procedures: no Current nasal medication use: none  --------------------------------------------------------- 07/15/2024 Seen in follow up for new complaint. In person interpreter used.  Epistaxis improved but now noted to have b/l hearing loss, chronic, ongoing for many years but now declined - family thinks he is reading lips. Does have h/o noise exposure due to [employer]. Attempted HT at outside facility but could not so referred here. Patient denies: ear pain, fullness, vertigo, drainage Patient additionally denies: deep pain in ear canal, eustachian tube symptoms such as popping, crackling, sensitive to pressure changes Patient also denies barotrauma, vestibular  suppressant use, ototoxic medication use Prior ear surgery: no   PMHx: Subdural hematoma s/p Rx 05/2022, HTN, RBBB, PAF, MVR, PVD s/p stenting  H&N Surgery: subdural hematoma evacuation Personal or FHx of bleeding dz or anesthesia difficulty: no  GLP-1: no AP/AC: ASA 81  Tobacco: quit several years ago. Lives in [place], [place]  Independent Review of Additional Tests or Records:  Dr. Roark ENT notes (2018): Epistaxis, right sided, no other nasal symptoms. Area of visible vessels right septum. Rx: observation and humidification [facility] (ENT 2021): Sore throat x1 week, former smoker, no other sx; TFL negative; Rx: augmentin, supportive care Dr. Valma referral notes (07/2023): right epistaxis, regular; low volume; Rx: humidification, Ref to ENT [facility] 05/2022: independent review shows right max opacification, scattered b/l ethmoid opacification, otherwise no significant pathology noted sinonasally Labs: CBC 05/2022: Hgb 12, Plt 195; CMP: Cr 1.1, LFTs not significantly elevated PMH/Meds/All/SocHx/FamHx/ROS:   Past Medical History:  Diagnosis Date   ALLERGIC RHINITIS 01/21/2007   Qualifier: Diagnosis of  By: Amon MD, Jose E.    Gout of ankle 03/21/2015   HEMORRHOIDS, INTERNAL W/O COMPLICATION 05/14/2007   Qualifier: Diagnosis of  By: Amon MD, Aloysius BRAVO.    HYPERTENSION 01/21/2007   Qualifier: Diagnosis of  By: Amon MD, Aloysius BRAVO.      Past Surgical History:  Procedure Laterality Date   ABDOMINAL AORTOGRAM W/LOWER EXTREMITY Bilateral 08/15/2019   Procedure: ABDOMINAL AORTOGRAM W/LOWER EXTREMITY;  Surgeon: Court Dorn PARAS, MD;  [facility];  Service: Cardiovascular;  Laterality: Bilateral;   ABDOMINAL AORTOGRAM W/LOWER EXTREMITY N/A 11/14/2019   Procedure: ABDOMINAL AORTOGRAM W/LOWER EXTREMITY;  Surgeon: Court Dorn PARAS, MD;  [facility]: [facility];  Service: Cardiovascular;  Laterality: N/A;   BURR HOLE Left 05/29/2022  Procedure: LEFT BURR HOLE FOR SUBDURAL HEMATOMA;   Surgeon: Cheryle Debby LABOR, MD;  Location: [facility];  Service: Neurosurgery;  Laterality: Left;   CATARACT EXTRACTION     PERIPHERAL VASCULAR INTERVENTION Right 11/14/2019   Procedure: PERIPHERAL VASCULAR INTERVENTION;  Surgeon: Court Dorn PARAS, MD;  [facility]: [facility];  Service: Cardiovascular;  Laterality: Right;    Family History  Problem Relation Age of Onset   Sudden death Father 31     Social Connections: Not on file      Current Outpatient Medications:    amLODipine -valsartan  (EXFORGE ) 5-160 MG tablet, TAKE 1 TABLET BY MOUTH DAILY (Patient taking differently: Take 1 tablet by mouth daily.), Disp: 90 tablet, Rfl: 0   atorvastatin  (LIPITOR ) 80 MG tablet, Take 1 tablet (80 mg total) by mouth daily. NEED OV. (Patient taking differently: Take 80 mg by mouth every evening.), Disp: 90 tablet, Rfl: 0   mupirocin  ointment (BACTROBAN ) 2 %, Apply 1 Application topically 2 (two) times daily. Use it in right nose twice per day, Disp: 22 g, Rfl: 0   simvastatin  (ZOCOR ) 40 MG tablet, Take 40 mg by mouth., Disp: , Rfl:    Ascorbic Acid (VITAMIN C) 1000 MG tablet, Take 500 mg by mouth See admin instructions. 500 mg in the morning  500 mg at noon (Patient not taking: Reported on 07/15/2024), Disp: , Rfl:    aspirin  EC 81 MG tablet, Take 81 mg by mouth at bedtime. (Patient not taking: Reported on 07/15/2024), Disp: , Rfl:    Cholecalciferol (VITAMIN D3) 50 MCG (2000 UT) capsule, Take 2,000 Units by mouth daily at 12 noon. (Patient not taking: Reported on 07/15/2024), Disp: , Rfl:    Physical Exam:   BP (!) 171/78 (BP Location: Right Arm, Patient Position: Sitting, Cuff Size: Large)   Pulse 72   Ht 5' 2 (1.575 m)   Wt 120 lb (54.4 kg)   SpO2 93%   BMI 21.95 kg/m   Salient findings:  CN II-XII intact  Bilateral EAC clear and TM intact with well pneumatized middle ear spaces Weber 512: UTA despite several attempts; Rinne 512: AC>BC Anterior rhinoscopy: septum deviates right, modest  prominent septal vessels b/l No lesions of oral cavity/oropharynx No obviously palpable neck masses/lymphadenopathy/thyromegaly No respiratory distress or stridor  Seprately Identifiable Procedures:  PROCEDURE: None today   Impression & Plans:  Danny Hudson is a 89 y.o. male with:  1. Epistaxis   2. Other specified hearing loss of both ears    Ears: Likely presbycusis but outside facility could not administer hearing test. As such, first recommend he get a hearing test; based on results, can consider HA Epistaxis: much improved; still with prominent vessels, but given improvement, rec humidification  See below regarding exact medications prescribed this encounter including dosages and route: No orders of the defined types were placed in this encounter.     Thank you for allowing me the opportunity to care for your patient. Please do not hesitate to contact me should you have any other questions.  Sincerely, Eldora Blanch, MD Otolarynoglogist (ENT), [facility] Phone: [phone] Fax: [phone]  07/30/2024, 8:17 AM   MDM:  I have personally spent 31 minutes involved in face-to-face and non-face-to-face activities for this patient on the day of the visit.  Professional time spent excludes any procedures performed but includes the following activities, in addition to those noted in the documentation: preparing to see the patient (review of outside documentation and results), performing a medically  appropriate examination, counseling, longer time needed given interpretation services needed, documenting in the electronic health record

## 2024-10-03 ENCOUNTER — Encounter (INDEPENDENT_AMBULATORY_CARE_PROVIDER_SITE_OTHER): Payer: Self-pay | Admitting: Otolaryngology

## 2024-10-03 ENCOUNTER — Ambulatory Visit (INDEPENDENT_AMBULATORY_CARE_PROVIDER_SITE_OTHER): Admitting: Otolaryngology

## 2024-10-03 ENCOUNTER — Ambulatory Visit (INDEPENDENT_AMBULATORY_CARE_PROVIDER_SITE_OTHER): Admitting: Audiology

## 2024-10-03 VITALS — BP 125/73 | HR 78 | Ht 62.0 in

## 2024-10-03 DIAGNOSIS — H903 Sensorineural hearing loss, bilateral: Secondary | ICD-10-CM

## 2024-10-03 DIAGNOSIS — R040 Epistaxis: Secondary | ICD-10-CM | POA: Diagnosis not present

## 2024-10-03 NOTE — Progress Notes (Signed)
 Dear Dr. Valma, Here is my assessment for our mutual patient, Danny Hudson. Thank you for allowing me the opportunity to care for your patient. Please do not hesitate to contact me should you have any other questions. Sincerely, Dr. Eldora Blanch  Otolaryngology Clinic Note Referring provider: Dr. Valma HPI:  Danny Hudson is a 89 y.o. male kindly referred by Dr. Valma for evaluation of right epistaxis.   Initial visit (08/2024): Daughter augments history. Reports that he has allergies, and that he has congestion with sneezing due to Allergies. He has typical AR symptoms and had a flare over past few months with frequent nose blowing. Over past month or so, however, has been having daily epistaxis for which they are being evaluated  Epistaxis history: bleeding every day, right side. Low volume, lasts for about 5 minutes. Stops on its own generally, sometimes puts tissue, no significant interventions. No ED visits HTN: no Frequency: daily Side: right CKD/Liver dysfunction: no Anticoagulation/AP: ASA 81 - stopped because of epistaxis Trauma: no History of Sinusitis: no, just has rhinorrhea during allergy season; otherwise no pressure/pain, other symptoms requiring antibiotics or steroids Nasal obstruction: no Nasal procedures: no Current nasal medication use: none  --------------------------------------------------------- 07/15/2024 Seen in follow up for new complaint. In person interpreter used.  Epistaxis improved but now noted to have b/l hearing loss, chronic, ongoing for many years but now declined - family thinks he is reading lips. Does have h/o noise exposure due to [place]. Attempted HT at outside facility but could not so referred here. Patient denies: ear pain, fullness, vertigo, drainage Patient additionally denies: deep pain in ear canal, eustachian tube symptoms such as popping, crackling, sensitive to pressure changes Patient also denies barotrauma, vestibular  suppressant use, ototoxic medication use Prior ear surgery: no  --------------------------------------------------------- 10/03/2024 Seen in follow up after HT. Stable sx. In person interpreter used. Hearing has not gotten any better or worse. No ear pain or drainage. Reports left worse than right because he lost part of his left hearing in the war.  Epistaxis rare, managing with afrin PRN  PMHx: Subdural hematoma s/p Rx 05/2022, HTN, RBBB, PAF, MVR, PVD s/p stenting  H&N Surgery: subdural hematoma evacuation Personal or FHx of bleeding dz or anesthesia difficulty: no  GLP-1: no AP/AC: ASA 81  Tobacco: quit several years ago. Lives in [place], [place]  Independent Review of Additional Tests or Records:  Dr. Roark ENT notes (2018): Epistaxis, right sided, no other nasal symptoms. Area of visible vessels right septum. Rx: observation and humidification [facility] (ENT 2021): Sore throat x1 week, former smoker, no other sx; TFL negative; Rx: augmentin, supportive care Dr. Valma referral notes (07/2023): right epistaxis, regular; low volume; Rx: humidification, Ref to ENT [facility] 05/2022: independent review shows right max opacification, scattered b/l ethmoid opacification, otherwise no significant pathology noted sinonasally Labs: CBC 05/2022: Hgb 12, Plt 195; CMP: Cr 1.1, LFTs not significantly elevated 09/2024 Audiogram was independently reviewed and interpreted by me and it reveals - A/As AD/AS tymps; asymmetric SNHL with AS>AD with AD mod downsloping to severe and AS sever downsloping to profound; SRT 65/70dB; CNT Discrim due to language  SNHL= Sensorineural hearing loss  PMH/Meds/All/SocHx/FamHx/ROS:   Past Medical History:  Diagnosis Date   ALLERGIC RHINITIS 01/21/2007   Qualifier: Diagnosis of  By: Amon MD, Jose E.    Gout of ankle 03/21/2015   HEMORRHOIDS, INTERNAL W/O COMPLICATION 05/14/2007   Qualifier: Diagnosis of  By: Amon MD, Aloysius BRAVO.    HYPERTENSION 01/21/2007  Qualifier:  Diagnosis of  By: Amon MD, Aloysius BRAVO.      Past Surgical History:  Procedure Laterality Date   ABDOMINAL AORTOGRAM W/LOWER EXTREMITY Bilateral 08/15/2019   Procedure: ABDOMINAL AORTOGRAM W/LOWER EXTREMITY;  Surgeon: Court Dorn PARAS, MD;  Location: [facility];  Service: Cardiovascular;  Laterality: Bilateral;   ABDOMINAL AORTOGRAM W/LOWER EXTREMITY N/A 11/14/2019   Procedure: ABDOMINAL AORTOGRAM W/LOWER EXTREMITY;  Surgeon: Court Dorn PARAS, MD;  Location: [facility];  Service: Cardiovascular;  Laterality: N/A;   BURR HOLE Left 05/29/2022   Procedure: LEFT BURR HOLE FOR SUBDURAL HEMATOMA;  Surgeon: Cheryle Debby LABOR, MD;  Location: [facility] OR;  Service: Neurosurgery;  Laterality: Left;   CATARACT EXTRACTION     PERIPHERAL VASCULAR INTERVENTION Right 11/14/2019   Procedure: PERIPHERAL VASCULAR INTERVENTION;  Surgeon: Court Dorn PARAS, MD;  Location: [facility];  Service: Cardiovascular;  Laterality: Right;    Family History  Problem Relation Age of Onset   Sudden death Father 26     Social Connections: Not on file      Current Outpatient Medications:    amLODipine -valsartan  (EXFORGE ) 5-160 MG tablet, TAKE 1 TABLET BY MOUTH DAILY (Patient taking differently: Take 1 tablet by mouth daily.), Disp: 90 tablet, Rfl: 0   atorvastatin  (LIPITOR ) 80 MG tablet, Take 1 tablet (80 mg total) by mouth daily. NEED OV. (Patient taking differently: Take 80 mg by mouth every evening.), Disp: 90 tablet, Rfl: 0   mupirocin  ointment (BACTROBAN ) 2 %, Apply 1 Application topically 2 (two) times daily. Use it in right nose twice per day, Disp: 22 g, Rfl: 0   simvastatin  (ZOCOR ) 40 MG tablet, Take 40 mg by mouth., Disp: , Rfl:    Ascorbic Acid (VITAMIN C) 1000 MG tablet, Take 500 mg by mouth See admin instructions. 500 mg in the morning  500 mg at noon (Patient not taking: Reported on 10/03/2024), Disp: , Rfl:    aspirin  EC 81 MG tablet, Take 81 mg by mouth at bedtime. (Patient not taking: Reported  on 10/03/2024), Disp: , Rfl:    Cholecalciferol (VITAMIN D3) 50 MCG (2000 UT) capsule, Take 2,000 Units by mouth daily at 12 noon. (Patient not taking: Reported on 10/03/2024), Disp: , Rfl:    Physical Exam:   BP 125/73 (BP Location: Right Arm, Patient Position: Sitting, Cuff Size: Large)   Pulse 78   Ht 5' 2 (1.575 m)   SpO2 92%   BMI 21.95 kg/m   Salient findings:  CN II-XII intact  Bilateral EAC clear and TM intact with well pneumatized middle ear spaces Weber 512: UTA despite several attempts; Rinne 512: AC>BC Anterior rhinoscopy: septum deviates right, modest prominent septal vessels b/l, persistent No lesions of oral cavity/oropharynx No obviously palpable neck masses/lymphadenopathy/thyromegaly No respiratory distress or stridor  Seprately Identifiable Procedures:  PROCEDURE: None today   Impression & Plans:  Danny Hudson is a 89 y.o. male with:  1. Sensorineural hearing loss (SNHL) of both ears   2. Asymmetrical sensorineural hearing loss    Ears: Likely presbycusis with asymmetry; discussed options (not WRT) including observation, HA v/s CI (would need further testing); also offered MRI for asymmetry but given age he declined. They opted for HA Epistaxis: much improved; still with prominent vessels, continue to rec humidification   See below regarding exact medications prescribed this encounter including dosages and route: No orders of the defined types were placed in this encounter.     Thank you for allowing me the opportunity  to care for your patient. Please do not hesitate to contact me should you have any other questions.  Sincerely, Eldora Blanch, MD Otolarynoglogist (ENT), [facility] Phone: [phone] Fax: [phone]  10/03/2024, 3:14 PM   MDM:  Mod, low, low

## 2024-10-03 NOTE — Progress Notes (Signed)
" °  [address] [place], [place] [address] [phone]  Audiological Evaluation    Name: Danny Hudson     DOB:   11/20/1927      MRN:   [id number]                                                                                     Service Date: 10/03/2024     Accompanied ab:tpqz and interpreter   Patient comes today after Dr. Tobie, ENT sent a referral for a hearing evaluation due to concerns with hearing loss.   Symptoms Yes Details  Hearing loss  [x]  Going on for a long time, reportedly lost left in [place] War  Tinnitus  []  none  Ear pain/ infections/pressure  []    Balance problems  [x]  Sleeps on left feels dizzy  Noise exposure history  [x]  [place] War  Previous ear surgeries  []  None  Family history of hearing loss  []    Amplification  []    Other  []      Otoscopy: Right ear: Clear external ear canal and notable landmarks visualized on the tympanic membrane. Left ear:  Clear external ear canal and notable landmarks visualized on the tympanic membrane.  Tympanometry: Right ear: Type A - Normal external ear canal volume with normal middle ear pressure and normal tympanic membrane compliance. Findings are consistent with normal middle ear function. Left ear: Type A - Normal external ear canal volume with normal middle ear pressure and normal tympanic membrane compliance. Findings are consistent with normal middle ear function.  Hearing Evaluation The hearing test results were completed under headphones and re-checked with inserts and results are deemed to be of fair reliability. Test technique:  conventional    Pure tone Audiometry: Right ear- Moderate to severe sensorineural hearing loss from 125 Hz - 8000 Hz. Left ear-  Moderately severe to profound sensorineural hearing loss from 125 Hz - 8000 Hz.  Speech Audiometry using pictureboard (limited set of 4 words): Right ear - 65dBHL Left ear- 70dBHL   Word Recognition Score - Could not test due to language  barrier.   Impression: There is a significant difference in pure-tone thresholds between ears, worse in the left ear.   Recommendations: Follow up with ENT as scheduled. Return for a hearing evaluation if concerns with hearing changes arise or per MD recommendation. Consider a communication needs assessment for amplification after medical clearance is obtained, if needed.   Nunzio Banet MARIE LEROUX-MARTINEZ, AUD  "

## 2024-10-03 NOTE — Patient Instructions (Signed)
 Afrin sopray
